# Patient Record
Sex: Female | Born: 1995 | Race: Black or African American | Hispanic: No | Marital: Single | Smoking: Never smoker
Health system: Southern US, Community
[De-identification: ages and names within clinical notes are randomized; demographics above are authoritative.]

## PROBLEM LIST (undated history)

## (undated) ENCOUNTER — Inpatient Hospital Stay (HOSPITAL_COMMUNITY): Payer: Self-pay

## (undated) DIAGNOSIS — Z789 Other specified health status: Secondary | ICD-10-CM

## (undated) HISTORY — PX: WISDOM TOOTH EXTRACTION: SHX21

---
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
## 2016-09-11 NOTE — L&D Delivery Note (Signed)
Delivery Note At 8:45 AM a viable female was delivered via Vaginal, Vacuum Investment banker, operational(Extractor) (Presentation:LOA ).  APGAR: 2, 5, 7; weight pending  .   Placenta status: intact, spontaneous.  Cord:  with the following complications: none .  Cord pH: 6.93.  Anesthesia:  None Episiotomy: Midline, vaginal band cut, then slight perineal episiotomy at midline by Dr Erin FullingHarraway-Smith Lacerations: Second degree Suture Repair: 3.0 vicryl Est. Blood Loss (mL):  150  Mom to postpartum.  Baby to NICU.  Melody Kim is a 21 y.o. female G1P1001 with IUP at 3311w1d pt of [facility] admitted for active labor at term.  She presented with ROM at home with light meconium, thicker meconium noted during delivery. She progressed without augmentation to complete and pushed less than 30 minutes to deliver.  During pushing, FHR to 90s, recovering to 120s and 130s intermittently.  Dr Erin FullingHarraway-Smith called to bedside to assess for vacuum.  NICU team called to be present for delivery.  Dr Erin FullingHarraway-Smith pushed with pt x1, then cut vaginal band of tissue and pushed x 1.  Vacuum applied x 2 pop-offs and additional small episiotomy cut at midline.  Vacuum used to assist during pushing but removed prior to delivery of infant. Cord clamped and cut immediately by Dr Erin FullingHarraway-Smith and infant taken to NICU staff who were present in room at birth time.  Infant stable and maintaining O2 saturation on RA but still transitioning when transported from delivery room to NICU.  FOB went with infant, mother given opportunity to see/hold infant briefly.   Cord gases drawn, limited sample for arterial, arterial and venous samples sent to lab.  Placenta intact and spontaneous, bleeding minimal.  Episiotomy/second degree laceration repaired without difficulty.  Rectal exam wnl.  Mom stable prior to transfer to postpartum. She plans on bottle feeding.   Misty StanleyLisa Leftwich-Kirby 08/16/2017, 9:50 AM

## 2016-12-27 ENCOUNTER — Ambulatory Visit (INDEPENDENT_AMBULATORY_CARE_PROVIDER_SITE_OTHER): Payer: Medicaid Other | Admitting: *Deleted

## 2016-12-27 ENCOUNTER — Encounter: Payer: Self-pay | Admitting: Family Medicine

## 2016-12-27 DIAGNOSIS — Z3201 Encounter for pregnancy test, result positive: Secondary | ICD-10-CM | POA: Diagnosis present

## 2016-12-27 DIAGNOSIS — Z3200 Encounter for pregnancy test, result unknown: Secondary | ICD-10-CM

## 2016-12-27 LAB — POCT PREGNANCY, URINE: PREG TEST UR: POSITIVE — AB

## 2016-12-27 NOTE — Progress Notes (Signed)
Pt informed of +UPT today.  LMP - 11/07/16.  EDD - 08/14/17.  Medication reconciliation completed.

## 2016-12-28 ENCOUNTER — Ambulatory Visit: Payer: Medicaid Other

## 2017-02-14 LAB — OB RESULTS CONSOLE RUBELLA ANTIBODY, IGM: RUBELLA: IMMUNE

## 2017-02-18 LAB — OB RESULTS CONSOLE HIV ANTIBODY (ROUTINE TESTING): HIV: NONREACTIVE

## 2017-02-18 LAB — OB RESULTS CONSOLE RPR: RPR: NONREACTIVE

## 2017-02-18 LAB — OB RESULTS CONSOLE GBS: GBS: NEGATIVE

## 2017-02-18 LAB — OB RESULTS CONSOLE GC/CHLAMYDIA
CHLAMYDIA, DNA PROBE: NEGATIVE
Gonorrhea: NEGATIVE

## 2017-02-18 LAB — OB RESULTS CONSOLE VARICELLA ZOSTER ANTIBODY, IGG: Varicella: IMMUNE

## 2017-02-18 LAB — OB RESULTS CONSOLE HEPATITIS B SURFACE ANTIGEN: Hepatitis B Surface Ag: NEGATIVE

## 2017-02-26 LAB — OB RESULTS CONSOLE HIV ANTIBODY (ROUTINE TESTING)
HIV: NONREACTIVE
HIV: NONREACTIVE
HIV: NONREACTIVE

## 2017-02-26 LAB — OB RESULTS CONSOLE ABO/RH: RH TYPE: POSITIVE

## 2017-02-26 LAB — OB RESULTS CONSOLE ANTIBODY SCREEN: Antibody Screen: NEGATIVE

## 2017-02-26 LAB — OB RESULTS CONSOLE GC/CHLAMYDIA
CHLAMYDIA, DNA PROBE: NEGATIVE
CHLAMYDIA, DNA PROBE: NEGATIVE
CHLAMYDIA, DNA PROBE: NEGATIVE
GC PROBE AMP, GENITAL: NEGATIVE
GC PROBE AMP, GENITAL: NEGATIVE
Gonorrhea: NEGATIVE

## 2017-02-26 LAB — OB RESULTS CONSOLE RUBELLA ANTIBODY, IGM
RUBELLA: IMMUNE
Rubella: IMMUNE
Rubella: IMMUNE

## 2017-02-26 LAB — OB RESULTS CONSOLE VARICELLA ZOSTER ANTIBODY, IGG
VARICELLA IGG: IMMUNE
Varicella: IMMUNE
Varicella: IMMUNE

## 2017-02-26 LAB — OB RESULTS CONSOLE HEPATITIS B SURFACE ANTIGEN
HEP B S AG: NEGATIVE
Hepatitis B Surface Ag: NEGATIVE
Hepatitis B Surface Ag: NEGATIVE

## 2017-02-26 LAB — OB RESULTS CONSOLE RPR
RPR: NONREACTIVE
RPR: NONREACTIVE
RPR: NONREACTIVE

## 2017-04-23 ENCOUNTER — Encounter (HOSPITAL_COMMUNITY): Payer: Self-pay

## 2017-04-23 ENCOUNTER — Inpatient Hospital Stay (HOSPITAL_COMMUNITY)
Admission: AD | Admit: 2017-04-23 | Discharge: 2017-04-23 | Disposition: A | Discharge status: Home / Self Care | Payer: Medicaid Other | Source: Ambulatory Visit | Attending: Obstetrics and Gynecology | Admitting: Obstetrics and Gynecology

## 2017-04-23 DIAGNOSIS — Z91018 Allergy to other foods: Secondary | ICD-10-CM | POA: Insufficient documentation

## 2017-04-23 DIAGNOSIS — R0602 Shortness of breath: Secondary | ICD-10-CM | POA: Diagnosis not present

## 2017-04-23 DIAGNOSIS — Z91012 Allergy to eggs: Secondary | ICD-10-CM | POA: Diagnosis not present

## 2017-04-23 DIAGNOSIS — Z7722 Contact with and (suspected) exposure to environmental tobacco smoke (acute) (chronic): Secondary | ICD-10-CM | POA: Diagnosis not present

## 2017-04-23 DIAGNOSIS — M940 Chondrocostal junction syndrome [Tietze]: Secondary | ICD-10-CM | POA: Diagnosis not present

## 2017-04-23 DIAGNOSIS — R079 Chest pain, unspecified: Secondary | ICD-10-CM | POA: Diagnosis present

## 2017-04-23 DIAGNOSIS — Z91011 Allergy to milk products: Secondary | ICD-10-CM | POA: Insufficient documentation

## 2017-04-23 DIAGNOSIS — R011 Cardiac murmur, unspecified: Secondary | ICD-10-CM | POA: Insufficient documentation

## 2017-04-23 LAB — POCT PREGNANCY, URINE: PREG TEST UR: POSITIVE — AB

## 2017-04-23 LAB — URINALYSIS, ROUTINE W REFLEX MICROSCOPIC
BILIRUBIN URINE: NEGATIVE
Glucose, UA: 50 mg/dL — AB
HGB URINE DIPSTICK: NEGATIVE
KETONES UR: NEGATIVE mg/dL
NITRITE: NEGATIVE
PROTEIN: NEGATIVE mg/dL
SPECIFIC GRAVITY, URINE: 1.024 (ref 1.005–1.030)
pH: 6 (ref 5.0–8.0)
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
## 2017-04-23 NOTE — MAU Provider Note (Signed)
History     CSN: [id number]  Arrival date and time: 04/23/17 1713   None     Chief Complaint  Patient presents with  . Chest Pain   Melody Kim is a 21 y.o. Female coming in for chest pain and SOB. She states she has been having chest pain for the past 4 days, intermittent,sternal, radiates to left and right along rib border, worse with sitting, not aggravated with activity, better with stretching and reproducible on palpation. SOB started today described as a feeling to need to take a deeper breath while sitting, no tachypnea, syncope, pleurisy with feeling. Otherwise healthy female.   No FHX of DVT, blood clots, heart disease. Non smoker.     Chest Pain   Associated symptoms include back pain, palpitations and shortness of breath. Pertinent negatives include no abdominal pain, cough, dizziness, fever, headaches, nausea, numbness or vomiting.      No past medical history on file.  No past surgical history on file.  No family history on file.  Social History  Substance Use Topics  . Smoking status: Passive Smoke Exposure - Never Smoker  . Smokeless tobacco: Not on file  . Alcohol use Not on file    Allergies:  Allergies  Allergen Reactions  . Chocolate   . Eggs Or Egg-Derived Products   . Milk-Related Compounds     No prescriptions prior to admission.    Review of Systems  Constitutional: Negative for fever.  Respiratory: Positive for shortness of breath. Negative for apnea, cough, chest tightness and wheezing.   Cardiovascular: Positive for chest pain and palpitations.  Gastrointestinal: Negative for abdominal pain, nausea and vomiting.  Musculoskeletal: Positive for back pain.  Neurological: Negative for dizziness, syncope, numbness and headaches.   Physical Exam   Blood pressure (!) 110/57, pulse (!) 109, temperature 98.4 F (36.9 C), temperature source Oral, resp. rate 16.  Physical Exam  Vitals reviewed. Constitutional: She appears  well-developed.  HENT:  Head: Normocephalic.  Eyes: Pupils are equal, round, and reactive to light.  Neck: No JVD present.  Cardiovascular: Normal rate and regular rhythm.   S1,S2 normal, 2/6 murmur loudest at along left sternal border on expiration.   Respiratory: Effort normal. No respiratory distress. She has no wheezes. She has no rales. She exhibits no tenderness.  Musculoskeletal: She exhibits no edema or tenderness.  Skin: No rash noted. No erythema.    MAU Course  ED EKG  Date/Time: 04/23/2017 6:16 PM Performed by: Joen Laura C Authorized by: Willodean Rosenthal  Interpreted by ED physician Previous ECG: no previous ECG available Rhythm: sinus tachycardia ST Segments: ST segments normal Clinical impression: normal ECG    MDM. Chest pain reproducible on exam.  EKG sinus tachycardia with no ST or T segment changes.    Assessment and Plan  Melody Kim is a 21 y.o. female coming in for chest pain and SOB.   Chest pain most likely costochondritis; hx reassuring against cardiac cause, EKG showed no ST elevation or depressions.   SOB: likely mild positional atelectasis; O2Sats reassuring in ED, no signs/symptoms of pneumonia.    New Problem: Murmur found on exam; new to patient. Should f/u with PCP.   Velta Addison White 04/23/2017, 6:03 PM   OB FELLOW MAU DISCHARGE ATTESTATION  I have seen and examined this patient; I agree with above documentation in the resident's note.  Highlights: Pain reproducible on exam. Some correlation with food as well. Patient in NAD, normal cardiothoracic exam other than  systolic flow murmur, likely related to pregnancy.  --D/c home in stable condition --Discussed return precautions in length, including worsening chest pain, SOB, pre-syncope/syncope., or any other new concerning symptoms.    Frederik PearJulie P Belinda Bringhurst, MD OB Fellow 6:20 PM

## 2017-07-20 LAB — OB RESULTS CONSOLE GBS: STREP GROUP B AG: NEGATIVE

## 2017-08-14 ENCOUNTER — Other Ambulatory Visit: Payer: Self-pay

## 2017-08-14 ENCOUNTER — Encounter (HOSPITAL_COMMUNITY): Payer: Self-pay

## 2017-08-14 ENCOUNTER — Inpatient Hospital Stay (HOSPITAL_COMMUNITY)
Admission: AD | Admit: 2017-08-14 | Discharge: 2017-08-14 | Disposition: A | Discharge status: Home / Self Care | Payer: Medicaid Other | Source: Ambulatory Visit | Attending: Family Medicine | Admitting: Family Medicine

## 2017-08-14 DIAGNOSIS — O471 False labor at or after 37 completed weeks of gestation: Secondary | ICD-10-CM | POA: Diagnosis not present

## 2017-08-14 DIAGNOSIS — Z3A40 40 weeks gestation of pregnancy: Secondary | ICD-10-CM | POA: Diagnosis not present

## 2017-08-14 NOTE — Discharge Instructions (Signed)

## 2017-08-14 NOTE — MAU Note (Signed)
Pt presents to MAU with c/o of contractions that started at 0400 today. Contractions have gotten stronger since then and are 6-8 min apart. Pt denies LOF and vaginal bleeding. +FM

## 2017-08-14 NOTE — MAU Note (Signed)
I have communicated with Steward DroneVeronica Rogers, CNM and reviewed vital signs:  Vitals:   08/14/17 1117  BP: 114/74  Pulse: 95  Resp: 18  Temp: 98.2 F (36.8 C)  SpO2: 100%    Vaginal exam:  Dilation: Fingertip Effacement (%): Thick Cervical Position: Posterior Station: -3 Presentation: Vertex Exam by:: [place]any Bently Morath, RN ,   Also reviewed contraction pattern and that non-stress test is reactive.  It has been documented that patient is contracting every 2-6 minutes since 0400; 0.5 cm not indicating active labor.  Patient denies any other complaints.  Based on this report provider has given order for discharge.  A discharge order and diagnosis entered by a provider.   Labor discharge instructions reviewed with patient.

## 2017-08-15 ENCOUNTER — Inpatient Hospital Stay (HOSPITAL_COMMUNITY)
Admission: AD | Admit: 2017-08-15 | Discharge: 2017-08-15 | Disposition: A | Discharge status: Home / Self Care | Payer: Medicaid Other | Source: Ambulatory Visit | Attending: Family Medicine | Admitting: Family Medicine

## 2017-08-15 ENCOUNTER — Encounter (HOSPITAL_COMMUNITY): Payer: Self-pay | Admitting: *Deleted

## 2017-08-15 DIAGNOSIS — O479 False labor, unspecified: Secondary | ICD-10-CM

## 2017-08-15 DIAGNOSIS — O471 False labor at or after 37 completed weeks of gestation: Secondary | ICD-10-CM

## 2017-08-15 HISTORY — DX: Other specified health status: Z78.9

## 2017-08-15 NOTE — Discharge Instructions (Signed)
Return to MAU if contractions get stronger and closer together (every 2-3 min consistently for 1-2 hours), or if your water breaks (large gush of fluid).     Braxton Hicks Contractions Contractions of the uterus can occur throughout pregnancy, but they are not always a sign that you are in labor. You may have practice contractions called Braxton Hicks contractions. These false labor contractions are sometimes confused with true labor. What are Melody PeltonBraxton Hicks contractions? Braxton Hicks contractions are tightening movements that occur in the muscles of the uterus before labor. Unlike true labor contractions, these contractions do not result in opening (dilation) and thinning of the cervix. Toward the end of pregnancy (32-34 weeks), Braxton Hicks contractions can happen more often and may become stronger. These contractions are sometimes difficult to tell apart from true labor because they can be very uncomfortable. You should not feel embarrassed if you go to the hospital with false labor. Sometimes, the only way to tell if you are in true labor is for your health care provider to look for changes in the cervix. The health care provider will do a physical exam and may monitor your contractions. If you are not in true labor, the exam should show that your cervix is not dilating and your water has not broken. If there are no prenatal problems or other health problems associated with your pregnancy, it is completely safe for you to be sent home with false labor. You may continue to have Braxton Hicks contractions until you go into true labor. How can I tell the difference between true labor and false labor?  Differences ? False labor ? Contractions last 30-70 seconds.: Contractions are usually shorter and not as strong as true labor contractions. ? Contractions become very regular.: Contractions are usually irregular. ? Discomfort is usually felt in the top of the uterus, and it spreads to the lower  abdomen and low back.: Contractions are often felt in the front of the lower abdomen and in the groin. ? Contractions do not go away with walking.: Contractions may go away when you walk around or change positions while lying down. ? Contractions usually become more intense and increase in frequency.: Contractions get weaker and are shorter-lasting as time goes on. ? The cervix dilates and gets thinner.: The cervix usually does not dilate or become thin. Follow these instructions at home:  Take over-the-counter and prescription medicines only as told by your health care provider.  Keep up with your usual exercises and follow other instructions from your health care provider.  Eat and drink lightly if you think you are going into labor.  If Braxton Hicks contractions are making you uncomfortable: ? Change your position from lying down or resting to walking, or change from walking to resting. ? Sit and rest in a tub of warm water. ? Drink enough fluid to keep your urine clear or pale yellow. Dehydration may cause these contractions. ? Do slow and deep breathing several times an hour.  Keep all follow-up prenatal visits as told by your health care provider. This is important. Contact a health care provider if:  You have a fever.  You have continuous pain in your abdomen. Get help right away if:  Your contractions become stronger, more regular, and closer together.  You have fluid leaking or gushing from your vagina.  You pass blood-tinged mucus (bloody show).  You have bleeding from your vagina.  You have low back pain that you never had before.  You feel your  babys head pushing down and causing pelvic pressure.  Your baby is not moving inside you as much as it used to. Summary  Contractions that occur before labor are called Braxton Hicks contractions, false labor, or practice contractions.  Braxton Hicks contractions are usually shorter, weaker, farther apart, and less  regular than true labor contractions. True labor contractions usually become progressively stronger and regular and they become more frequent.  Manage discomfort from [facility] contractions by changing position, resting in a warm bath, drinking plenty of water, or practicing deep breathing. This information is not intended to replace advice given to you by your health care provider. Make sure you discuss any questions you have with your health care provider. Document Released: 08/28/2005 Document Revised: 07/17/2016 Document Reviewed: 07/17/2016 Elsevier Interactive Patient Education  2017 [employer]

## 2017-08-15 NOTE — Discharge Instructions (Signed)

## 2017-08-15 NOTE — MAU Note (Signed)
Pt presents with c/o onset of regular ctxs since 0400 this morning.  Reports ctxs are every 2 minutes.  Denies VB, states leaking white fluid since 2200 last night.  Reports +FM.   Pt states seen in MAU yesterday for labor eva, cervix 1cm.l

## 2017-08-15 NOTE — Progress Notes (Signed)
Pt instructed she may ambulate.  Instructed to return to unit @ 1900 or sooner if ROM, VB, or any other concerns.  Pt verbalized understanding & agreeable.

## 2017-08-15 NOTE — MAU Note (Signed)
Pt reports worsening contractions, back pain and gushes of fluid since 2200.

## 2017-08-16 ENCOUNTER — Encounter (HOSPITAL_COMMUNITY): Payer: Self-pay | Admitting: *Deleted

## 2017-08-16 ENCOUNTER — Inpatient Hospital Stay (HOSPITAL_COMMUNITY)
Admission: AD | Admit: 2017-08-16 | Discharge: 2017-08-18 | DRG: 806 | Disposition: A | Discharge status: Home / Self Care | Payer: Medicaid Other | Source: Ambulatory Visit | Attending: Obstetrics & Gynecology | Admitting: Obstetrics & Gynecology

## 2017-08-16 ENCOUNTER — Other Ambulatory Visit: Payer: Self-pay

## 2017-08-16 DIAGNOSIS — N390 Urinary tract infection, site not specified: Secondary | ICD-10-CM | POA: Diagnosis not present

## 2017-08-16 DIAGNOSIS — Z3483 Encounter for supervision of other normal pregnancy, third trimester: Secondary | ICD-10-CM | POA: Diagnosis present

## 2017-08-16 DIAGNOSIS — O8620 Urinary tract infection following delivery, unspecified: Secondary | ICD-10-CM | POA: Diagnosis present

## 2017-08-16 DIAGNOSIS — Z3A40 40 weeks gestation of pregnancy: Secondary | ICD-10-CM | POA: Diagnosis not present

## 2017-08-16 DIAGNOSIS — O4290 Premature rupture of membranes, unspecified as to length of time between rupture and onset of labor, unspecified weeks of gestation: Secondary | ICD-10-CM | POA: Diagnosis present

## 2017-08-16 LAB — TYPE AND SCREEN
ABO/RH(D): O POS
Antibody Screen: NEGATIVE

## 2017-08-16 LAB — ABO/RH: ABO/RH(D): O POS

## 2017-08-16 LAB — CBC
HEMATOCRIT: 36.6 % (ref 36.0–46.0)
HEMOGLOBIN: 11.2 g/dL — AB (ref 12.0–15.0)
MCH: 20.9 pg — ABNORMAL LOW (ref 26.0–34.0)
MCHC: 30.6 g/dL (ref 30.0–36.0)
MCV: 68.2 fL — AB (ref 78.0–100.0)
PLATELETS: 323 10*3/uL (ref 150–400)
RBC: 5.37 MIL/uL — AB (ref 3.87–5.11)
RDW: 15.6 % — ABNORMAL HIGH (ref 11.5–15.5)
WBC: 15.4 10*3/uL — AB (ref 4.0–10.5)

## 2017-08-16 LAB — RPR: RPR: NONREACTIVE

## 2017-08-16 MED ORDER — COCONUT OIL OIL: 1.0000 "application " | TOPICAL_OIL | Status: DC | PRN

## 2017-08-16 MED ORDER — LIDOCAINE HCL (PF) 1 % IJ SOLN
30.0000 mL | INTRAMUSCULAR | Status: DC | PRN
  Administered 2017-08-16: 30 mL via SUBCUTANEOUS
  Filled 2017-08-16: qty 30

## 2017-08-16 MED ORDER — OXYTOCIN 40 UNITS IN LACTATED RINGERS INFUSION - SIMPLE MED
2.5000 [IU]/h | INTRAVENOUS | Status: DC
  Administered 2017-08-16: 2.5 [IU]/h via INTRAVENOUS
  Filled 2017-08-16: qty 1000

## 2017-08-16 MED ORDER — ONDANSETRON HCL 4 MG/2ML IJ SOLN: 4.0000 mg | INTRAMUSCULAR | Status: DC | PRN

## 2017-08-16 MED ORDER — SOD CITRATE-CITRIC ACID 500-334 MG/5ML PO SOLN: 30.0000 mL | ORAL | Status: DC | PRN

## 2017-08-16 MED ORDER — WITCH HAZEL-GLYCERIN EX PADS: 1.0000 "application " | MEDICATED_PAD | CUTANEOUS | Status: DC | PRN

## 2017-08-16 MED ORDER — FENTANYL 2.5 MCG/ML BUPIVACAINE 1/10 % EPIDURAL INFUSION (WH - ANES): 14.0000 mL/h | INTRAMUSCULAR | Status: DC | PRN

## 2017-08-16 MED ORDER — PRENATAL MULTIVITAMIN CH: 1.0000 | ORAL_TABLET | Freq: Every day | ORAL | Status: DC

## 2017-08-16 MED ORDER — ONDANSETRON HCL 4 MG/2ML IJ SOLN: 4.0000 mg | Freq: Four times a day (QID) | INTRAMUSCULAR | Status: DC | PRN

## 2017-08-16 MED ORDER — ACETAMINOPHEN 160 MG/5ML PO SOLN: 650.0000 mg | ORAL | Status: DC | PRN

## 2017-08-16 MED ORDER — DIPHENHYDRAMINE HCL 50 MG/ML IJ SOLN: 12.5000 mg | INTRAMUSCULAR | Status: DC | PRN

## 2017-08-16 MED ORDER — COMPLETENATE 29-1 MG PO CHEW
1.0000 | CHEWABLE_TABLET | Freq: Every day | ORAL | Status: DC
  Administered 2017-08-16 – 2017-08-18 (×3): 1 via ORAL
  Filled 2017-08-16 (×4): qty 1

## 2017-08-16 MED ORDER — ACETAMINOPHEN 325 MG PO TABS: 650.0000 mg | ORAL_TABLET | ORAL | Status: DC | PRN

## 2017-08-16 MED ORDER — BENZOCAINE-MENTHOL 20-0.5 % EX AERO
1.0000 "application " | INHALATION_SPRAY | CUTANEOUS | Status: DC | PRN
  Administered 2017-08-16: 1 via TOPICAL
  Filled 2017-08-16: qty 56

## 2017-08-16 MED ORDER — PHENYLEPHRINE 40 MCG/ML (10ML) SYRINGE FOR IV PUSH (FOR BLOOD PRESSURE SUPPORT): 80.0000 ug | PREFILLED_SYRINGE | INTRAVENOUS | Status: DC | PRN

## 2017-08-16 MED ORDER — EPHEDRINE 5 MG/ML INJ: 10.0000 mg | INTRAVENOUS | Status: DC | PRN

## 2017-08-16 MED ORDER — TETANUS-DIPHTH-ACELL PERTUSSIS 5-2.5-18.5 LF-MCG/0.5 IM SUSP: 0.5000 mL | Freq: Once | INTRAMUSCULAR | Status: DC

## 2017-08-16 MED ORDER — EPHEDRINE 5 MG/ML INJ
10.0000 mg | INTRAVENOUS | Status: DC | PRN
Start: 2017-08-16 — End: 2017-08-16

## 2017-08-16 MED ORDER — DIPHENHYDRAMINE HCL 25 MG PO CAPS: 25.0000 mg | ORAL_CAPSULE | Freq: Four times a day (QID) | ORAL | Status: DC | PRN

## 2017-08-16 MED ORDER — SENNOSIDES-DOCUSATE SODIUM 8.6-50 MG PO TABS
2.0000 | ORAL_TABLET | ORAL | Status: DC
  Filled 2017-08-16: qty 2

## 2017-08-16 MED ORDER — SIMETHICONE 80 MG PO CHEW: 80.0000 mg | CHEWABLE_TABLET | ORAL | Status: DC | PRN

## 2017-08-16 MED ORDER — OXYTOCIN BOLUS FROM INFUSION
500.0000 mL | Freq: Once | INTRAVENOUS | Status: AC
  Administered 2017-08-16: 500 mL via INTRAVENOUS

## 2017-08-16 MED ORDER — LACTATED RINGERS IV SOLN
500.0000 mL | INTRAVENOUS | Status: DC | PRN
  Administered 2017-08-16: 1000 mL via INTRAVENOUS

## 2017-08-16 MED ORDER — OXYCODONE-ACETAMINOPHEN 5-325 MG PO TABS: 2.0000 | ORAL_TABLET | ORAL | Status: DC | PRN

## 2017-08-16 MED ORDER — LACTATED RINGERS IV SOLN
INTRAVENOUS | Status: DC
  Administered 2017-08-16: 08:00:00 via INTRAVENOUS

## 2017-08-16 MED ORDER — DIBUCAINE 1 % RE OINT: 1.0000 "application " | TOPICAL_OINTMENT | RECTAL | Status: DC | PRN

## 2017-08-16 MED ORDER — FENTANYL CITRATE (PF) 100 MCG/2ML IJ SOLN: 100.0000 ug | INTRAMUSCULAR | Status: DC | PRN

## 2017-08-16 MED ORDER — OXYCODONE-ACETAMINOPHEN 5-325 MG PO TABS: 1.0000 | ORAL_TABLET | ORAL | Status: DC | PRN

## 2017-08-16 MED ORDER — IBUPROFEN 100 MG/5ML PO SUSP
600.0000 mg | Freq: Four times a day (QID) | ORAL | Status: DC | PRN
  Filled 2017-08-16: qty 30

## 2017-08-16 MED ORDER — LACTATED RINGERS IV SOLN: 500.0000 mL | Freq: Once | INTRAVENOUS | Status: DC

## 2017-08-16 MED ORDER — IBUPROFEN 100 MG/5ML PO SUSP
600.0000 mg | Freq: Four times a day (QID) | ORAL | Status: DC
  Administered 2017-08-16 – 2017-08-18 (×9): 600 mg via ORAL
  Filled 2017-08-16 (×13): qty 30

## 2017-08-16 MED ORDER — ZOLPIDEM TARTRATE 5 MG PO TABS
5.0000 mg | ORAL_TABLET | Freq: Every evening | ORAL | Status: DC | PRN
Start: 2017-08-16 — End: 2017-08-18

## 2017-08-16 MED ORDER — ONDANSETRON HCL 4 MG PO TABS: 4.0000 mg | ORAL_TABLET | ORAL | Status: DC | PRN

## 2017-08-16 NOTE — Progress Notes (Signed)
This note also relates to the following rows which could not be included: BP - Cannot attach notes to unvalidated device data Pulse Rate - Cannot attach notes to unvalidated device data  NICU in room for del

## 2017-08-16 NOTE — H&P (Signed)
Melody Kim is a 21 y.o. female G1P1001 @[redacted]w[redacted]d  pt of [facility] presenting for rupture of membranes and active labor. She presented to MAU in active labor at 9.5 cm and [facility] She reports good fetal movement, and reports leakage of fluid is clear.  She denies vaginal bleeding, vaginal itching/burning, urinary symptoms, h/a, dizziness, n/v, or fever/chills.      OB History    Gravida Para Term Preterm AB Living   1 1 1     1    SAB TAB Ectopic Multiple Live Births         0 1     Past Medical History:  Diagnosis Date  . Medical history non-contributory    Past Surgical History:  Procedure Laterality Date  . WISDOM TOOTH EXTRACTION     Family History: family history includes Diabetes in her father and mother. Social History:  reports that  has never smoked. she has never used smokeless tobacco. She reports that she does not drink alcohol or use drugs.     Maternal Diabetes: No Genetic Screening: Normal Maternal Ultrasounds/Referrals: Normal Fetal Ultrasounds or other Referrals:  None Maternal Substance Abuse:  No Significant Maternal Medications:  None Significant Maternal Lab Results:  Lab values include: Group B Strep negative Other Comments:  None  Review of Systems  Constitutional: Negative for chills, fever and malaise/fatigue.  Eyes: Negative for blurred vision.  Respiratory: Negative for cough and shortness of breath.   Cardiovascular: Negative for chest pain.  Gastrointestinal: Positive for abdominal pain. Negative for heartburn and vomiting.  Genitourinary: Negative for dysuria, frequency and urgency.  Musculoskeletal: Negative.   Neurological: Negative for dizziness and headaches.  Psychiatric/Behavioral: Negative for depression.   Maternal Medical History:  Reason for admission: Rupture of membranes and contractions.   Contractions: Onset was 3-5 hours ago.   Frequency: regular.   Perceived severity is strong.    Fetal  activity: Perceived fetal activity is normal.   Last perceived fetal movement was within the past hour.    Prenatal complications: no prenatal complications Prenatal Complications - Diabetes: none.    Dilation: 10 Station: +3 Exam by:: Leftwich-Kirby Blood pressure (!) 103/49, pulse (!) 103, temperature 98.2 F (36.8 C), temperature source Oral, resp. rate 18, height 4\' 11"  (1.499 m), weight 131 lb (59.4 kg), SpO2 98 %, unknown if currently breastfeeding. Maternal Exam:  Uterine Assessment: Contraction strength is moderate.    Fetal Exam Fetal Monitor Review: Mode: ultrasound.   Baseline rate: 130.  Variability: moderate (6-25 bpm).   FHR baseline 130 with moderate variability. FHR 130 when EFM placed, and rose to 150 x 2 minutes, then returned to baseline of 130 x several minutes.  FHR down to 100 x 2-3 minutes and returned to 120 prior to [facility].      Physical Exam  Nursing note and vitals reviewed. Constitutional: She is oriented to person, place, and time. She appears well-developed and well-nourished.  Neck: Normal range of motion.  Cardiovascular: Normal rate.  Respiratory: Effort normal.  GI: Soft.  Musculoskeletal: Normal range of motion.  Neurological: She is alert and oriented to person, place, and time.  Skin: Skin is warm and dry.  Psychiatric: She has a normal mood and affect. Her behavior is normal. Judgment and thought content normal.    Prenatal labs: ABO, Rh: --/--/O POS (12/06 0802) Antibody: NEG (12/06 0802) Rubella: Immune, Immune, Immune (06/18 0000) RPR: Non Reactive (12/06 0802)  HBsAg: Negative, Negative, Negative (  06/18 0000)  HIV: Non-reactive, Non-reactive, Non-reactive (06/18 0000)  GBS: Negative (11/09 0000)   Assessment/Plan: G1P1001 @[redacted]w[redacted]d   Active labor at term SROM GBS negative  [facility] Anticipate NSVD  Sharen CounterLisa Leftwich-Kirby 08/16/2017, 5:39 PM

## 2017-08-16 NOTE — MAU Note (Signed)
Pt reports water broke about 7am clear fluid. C/o contractions. 4cm  Yesterday in office.

## 2017-08-17 NOTE — Progress Notes (Signed)
Post Partum Day 1 Subjective: no complaints, up ad lib, voiding and tolerating PO  Objective: Blood pressure 107/64, pulse (!) 125, temperature 98.3 F (36.8 C), temperature source Oral, resp. rate 18, height 4\' 11"  (1.499 m), weight 131 lb (59.4 kg), SpO2 100 %, unknown if currently breastfeeding.  Physical Exam:  General: alert, cooperative and appears stated age Lochia: appropriate Uterine Fundus: firm DVT Evaluation: Negative Homan's sign.  Recent Labs    08/16/17 0800  HGB 11.2*  HCT 36.6    Assessment/Plan: Plan for discharge tomorrow, Breastfeeding and Contraception condoms   LOS: 1 day   Melody Kim 08/17/2017, 12:36 PM

## 2017-08-17 NOTE — Lactation Note (Signed)
This note was copied from a baby's chart. Lactation Consultation Note  Patient Name: Melody Kim ZOXWR'UToday's Date: 08/17/2017 Reason for consult: Initial assessment;Term;NICU baby;Primapara;1st time breastfeeding   Initial consult at 9336 hrs old with NICU mom; Mom is P1.  GA 40.1; BW 6 lbs, 10 oz.  Infant had respiratory depression at birth with apgars 2/5/7; large umbilical hernia, poor tone; r/o sepsis.   RN started mom pumping last night with DEBP.   NICU booklet given to mom and reviewed info in booklet.  Encouraged mom to start pumping log in am; instructed to pump every 2 hrs during the day and at least once during the night for a total of 8+ pumpings per day. Gave curved-tip syring and colostrum collection containers. Taught hand expression with return demonstration and observation of colostrum barely glistening tip of nipple. Mom had pumped 2-3 ml clear thin colostrum ~1400 earlier today that was still in container.   Reviewed proper milk storage; Labels ordered and RN took in to mom's room.  Instructed to label with date and time of pumping.   Taught mom how to pump on "initiate" setting turning dial up to 3-4 drops as the setting using hands-on pumping with hand expression at end of pumping session for an additional 10 minutes.   [place]  WIC has already visited mom and given her a pump.  WIC pump in room with mom.     Maternal Data Has patient been taught Hand Expression?: Yes(with return demonstration; colostrum barely glistening tip after several minutes of HE) Does the patient have breastfeeding experience prior to this delivery?: No  Feeding    LATCH Score       Type of Nipple: Everted at rest and after stimulation  Comfort (Breast/Nipple): Soft / non-tender        Interventions Interventions: Expressed milk;Hand express  Lactation Tools Discussed/Used WIC Program: Yes Pump Review: Setup, frequency, and cleaning;Milk  Storage Initiated by:: RN Date initiated:: 08/16/17   Consult Status Consult Status: Follow-up Follow-up type: In-patient    Lendon KaVann, Ellenore Roscoe Walker 08/17/2017, 9:17 PM

## 2017-08-18 DIAGNOSIS — N390 Urinary tract infection, site not specified: Secondary | ICD-10-CM | POA: Diagnosis not present

## 2017-08-18 LAB — URINALYSIS, ROUTINE W REFLEX MICROSCOPIC
Bilirubin Urine: NEGATIVE
GLUCOSE, UA: NEGATIVE mg/dL
Hgb urine dipstick: NEGATIVE
KETONES UR: NEGATIVE mg/dL
Nitrite: POSITIVE — AB
PH: 6 (ref 5.0–8.0)
PROTEIN: 100 mg/dL — AB
Specific Gravity, Urine: 1.021 (ref 1.005–1.030)

## 2017-08-18 LAB — CBC
HCT: 30 % — ABNORMAL LOW (ref 36.0–46.0)
Hemoglobin: 9.4 g/dL — ABNORMAL LOW (ref 12.0–15.0)
MCH: 21.3 pg — AB (ref 26.0–34.0)
MCHC: 31.3 g/dL (ref 30.0–36.0)
MCV: 67.9 fL — AB (ref 78.0–100.0)
PLATELETS: 158 10*3/uL (ref 150–400)
RBC: 4.42 MIL/uL (ref 3.87–5.11)
RDW: 15.8 % — AB (ref 11.5–15.5)
WBC: 7.8 10*3/uL (ref 4.0–10.5)

## 2017-08-18 MED ORDER — CEPHALEXIN 500 MG PO CAPS: 500.0000 mg | ORAL_CAPSULE | Freq: Four times a day (QID) | ORAL | 0 refills | Status: DC

## 2017-08-18 MED ORDER — CEPHALEXIN 500 MG PO CAPS
500.0000 mg | ORAL_CAPSULE | Freq: Four times a day (QID) | ORAL | Status: DC
  Administered 2017-08-18 (×2): 500 mg via ORAL
  Filled 2017-08-18 (×2): qty 1

## 2017-08-18 MED ORDER — IBUPROFEN 100 MG/5ML PO SUSP: 600.0000 mg | Freq: Four times a day (QID) | ORAL | 0 refills | Status: DC

## 2017-08-18 NOTE — Lactation Note (Signed)
This note was copied from a baby's chart. Lactation Consultation Note: Follow up visit with this mom of NICU baby. Reports she pumped 6 times yesterday but did not obtain any Colostrum. Encouraged hand expression after pumping. [facility]. States she does not plan to put baby to the breast- will pump and bottle feed EBM and formula. No questions at present. To call prn  Patient Name: Melody Kim WUJWJ'XToday's Date: 08/18/2017 Reason for consult: Follow-up assessment;NICU baby   Maternal Data Formula Feeding for Exclusion: Yes Reason for exclusion: Mother's choice to formula feed on admision Has patient been taught Hand Expression?: Yes Does the patient have breastfeeding experience prior to this delivery?: No  Feeding    LATCH Score                   Interventions    Lactation Tools Discussed/Used WIC Program: Yes   Consult Status Consult Status: Complete    Pamelia HoitWeeks, Madeeha Costantino D 08/18/2017, 7:49 AM

## 2017-08-18 NOTE — Progress Notes (Signed)
Post Partum Day 2 Subjective: no complaints and up ad lib has been afebrile without discomfort since this a.m. Will treat for presumed uti. Pt reports that her baby is stable on cooling blanket downstairs, to come off tomorrow. Objective: Blood pressure (!) 95/56, pulse (!) 121, temperature 98.6 F (37 C), resp. rate 20, height 4\' 11"  (1.499 m), weight 131 lb (59.4 kg), SpO2 100 %, unknown if currently breastfeeding.  Physical Exam:  General: alert and cooperative Lochia: appropriate Uterine Fundus: firm Incision:  DVT Evaluation: No evidence of DVT seen on physical exam.  Recent Labs    08/16/17 0800 08/18/17 0651  HGB 11.2* 9.4*  HCT 36.6 30.0*    Assessment/Plan: PP uti  normal pp care Discharge home   LOS: 2 days   Tilda BurrowJohn V Makaylyn Sinyard 08/18/2017, 3:20 PM

## 2017-08-18 NOTE — Discharge Summary (Signed)
OB Discharge Summary     Patient Name: Melody Kim DOB: Mar 30, 1996 MRN: [id number]  Date of admission: 08/16/2017 Delivering MD: Sharen CounterLEFTWICH-KIRBY, Melody A   Date of discharge: 08/18/2017  Admitting diagnosis: WATER BROKE, meconium stained fluid. Intrauterine pregnancy: 4037w1d     Secondary diagnosis:  Active Problems:   Amniotic fluid leaking   UTI (urinary tract infection) Meconium stained fluid  Additional problems:      Discharge diagnosis: Term Pregnancy Delivered                                                                                               Meconium staining                                              Fetal condition requiring NICU admit                                                 Post partum procedures:urine culture  Augmentation: Pitocin     Hospital course:  Onset of Labor With Vaginal Delivery     21 y.o. yo G1P1001 at 5237w1d was admitted in Active Labor on 08/16/2017. Patient had acomplicated labor course as follows: Complications:   Thick meconiumAlexzandria S Leary Kim is a 21 y.o. female G1P1001 with IUP at 937w1d pt of [facility] admitted for active labor at term.  She presented with ROM at home with light meconium, thicker meconium noted during delivery. She progressed without augmentation to complete and pushed less than 30 minutes to deliver.  During pushing, FHR to 90s, recovering to 120s and 130s intermittently.  Dr Melody Kim called to bedside to assess for vacuum.  NICU team called to be present for delivery.  Dr Melody Kim pushed with pt x1, then cut vaginal band of tissue and pushed x 1.  Vacuum applied x 2 pop-offs and additional small episiotomy cut at midline.  Vacuum used to assist during pushing but removed prior to delivery of infant. Cord clamped and cut immediately by Dr Melody Kim and infant taken to NICU staff who were present in room at birth time.  Infant stable and maintaining O2 saturation on RA but still transitioning  when transported from delivery room to NICU.  FOB went with infant, mother given opportunity to see/hold infant briefly.   Cord gases drawn, limited sample for arterial, arterial and venous samples sent to lab.  Placenta intact and spontaneous, bleeding minimal.  Episiotomy/second degree laceration repaired without difficulty.  Rectal exam wnl.  Mom stable prior to transfer to postpartum. She plans on bottle feeding.   Membrane Rupture Time/Date: 7:00 AM ,08/16/2017   Intrapartum Procedures: Episiotomy: Median [2]  Lacerations:  2nd degree [3]  Patient had a delivery of a Viable infant. 08/16/2017  Information for the patient's newborn:  Melody SeverinMahoney, Girl Melody [[id number]][[id number]]  Delivery Method: Vaginal, Vacuum (Extractor)(Filed from Delivery Summary)    Pateint had an uncomplicated postpartum course.  She is ambulating, tolerating a regular diet, passing flatus, and urinating well. Patient is discharged home in stable condition on 08/18/17.   Physical exam  Vitals:   08/17/17 1901 08/17/17 2146 08/18/17 0636 08/18/17 0812  BP:  104/62 113/70 (!) 95/56  Pulse:  (!) 122 (!) 145 (!) 121  Resp:  18 18 20   Temp: 99.5 F (37.5 C) 98.6 F (37 C) (!) 100.7 F (38.2 C) 98.6 F (37 C)  TempSrc: Oral Oral Oral   SpO2:  100% 100%   Weight:      Height:       General: alert and cooperative Lochia: appropriate Uterine Fundus: firm Incision:  DVT Evaluation: No evidence of DVT seen on physical exam. Labs: Lab Results  Component Value Date   WBC 7.8 08/18/2017   HGB 9.4 (L) 08/18/2017   HCT 30.0 (L) 08/18/2017   MCV 67.9 (L) 08/18/2017   PLT 158 08/18/2017   CMP Latest Ref Rng & Units 08/08/2014  Glucose 70 - 99 mg/dL 782(N101(H)  BUN 6 - 23 mg/dL 18  Creatinine 5.620.50 - 1.301.00 mg/dL 8.650.67  Sodium 784137 - 696147 mEq/L 138  Potassium 3.7 - 5.3 mEq/L 4.0  Chloride 96 - 112 mEq/L 103  CO2 19 - 32 mEq/L 20  Calcium 8.4 - 10.5 mg/dL 9.7  Total Protein 6.0 - 8.3  g/dL 8.3  Total Bilirubin 0.3 - 1.2 mg/dL <2.9(B<0.2(L)  Alkaline Phos 47 - 119 U/L 108  AST 0 - 37 U/L 21  ALT 0 - 35 U/L 23    Discharge instruction: per After Visit Summary and "Baby and Me Booklet".  After visit meds:  Allergies as of 08/18/2017   No Known Allergies     Medication List    TAKE these medications   cephALEXin 500 MG capsule Commonly known as:  KEFLEX Take 1 capsule (500 mg total) by mouth 4 (four) times daily.   hydrocortisone cream 1 % Apply 1 application topically 2 (two) times daily.   ibuprofen 100 MG/5ML suspension Commonly known as:  ADVIL,MOTRIN Take 30 mLs (600 mg total) by mouth every 6 (six) hours.   prenatal multivitamin Tabs tablet Take 1 tablet by mouth daily at 12 noon.       Diet: routine diet  Activity: Advance as tolerated. Pelvic rest for 6 weeks.   Outpatient follow up:6 weeks Follow up Appt:No future appointments. Follow up Visit:No Follow-up on file.  [facility]  Newborn Data: Live born female  Birth Weight: 6 lb 10.2 oz (3010 g) APGAR: 2, 5  Newborn Delivery   Birth date/time:  08/16/2017 08:45:00 Delivery type:  Vaginal, Vacuum (Extractor)     Baby Feeding: Bottle Disposition:NICU   08/18/2017 Melody BurrowJohn V Sharma Lawrance, MD

## 2017-08-18 NOTE — Progress Notes (Signed)
Post Partum Day #2 Subjective: up ad lib, voiding and tolerating PO; baby in NICU- pumping breasts; desires condoms or Depo for contraception; reports slight intermittent H/A and +chills, but no other report of pain/dysuria/cough  Objective: Blood pressure 113/70, pulse (!) 145, temperature (!) 100.7 F (38.2 C), temperature source Oral, resp. rate 18, height 4\' 11"  (1.499 m), weight 59.4 kg (131 lb), SpO2 100 %, unknown if currently breastfeeding.  Physical Exam:  General: alert, cooperative and no distress Lochia: appropriate Uterine Fundus: firm, nontender DVT Evaluation: No evidence of DVT seen on physical exam.  Recent Labs    08/16/17 0800 08/18/17 0651  HGB 11.2* 9.4*  HCT 36.6 30.0*   CBC    Component Value Date/Time   WBC 7.8 08/18/2017 0651   RBC 4.42 08/18/2017 0651   HGB 9.4 (L) 08/18/2017 0651   HCT 30.0 (L) 08/18/2017 0651   PLT 158 08/18/2017 0651   MCV 67.9 (L) 08/18/2017 0651   MCH 21.3 (L) 08/18/2017 0651   MCHC 31.3 08/18/2017 0651   RDW 15.8 (H) 08/18/2017 0651   Urinalysis    Component Value Date/Time   COLORURINE YELLOW 08/18/2017 0657   APPEARANCEUR HAZY (A) 08/18/2017 0657   LABSPEC 1.021 08/18/2017 0657   PHURINE 6.0 08/18/2017 0657   GLUCOSEU NEGATIVE 08/18/2017 0657   HGBUR NEGATIVE 08/18/2017 0657   BILIRUBINUR NEGATIVE 08/18/2017 0657   KETONESUR NEGATIVE 08/18/2017 0657   PROTEINUR 100 (A) 08/18/2017 0657   NITRITE POSITIVE (A) 08/18/2017 0657   LEUKOCYTESUR TRACE (A) 08/18/2017 0657     Assessment/Plan: PPD#2 Febrile- probable UTI  Start on Keflex 500mg  qid Rev'd importance of reliable method of contraception- pt will consider LARC but not likely Reeval later this afternoon to assess for d/c today vs tomorrow    LOS: 2 days   Cam HaiSHAW, Leanah Kolander CNM 08/18/2017, 7:55 AM

## 2017-08-18 NOTE — Progress Notes (Signed)
Discharge instructions reviewed with patient. Patient instructed of medication changes and postpartum instructions. Paperwork reviewed and signed.

## 2017-08-20 LAB — URINE CULTURE

## 2018-09-25 ENCOUNTER — Emergency Department (HOSPITAL_COMMUNITY)
Admission: EM | Admit: 2018-09-25 | Discharge: 2018-09-25 | Disposition: A | Discharge status: Home / Self Care | Payer: Medicaid Other | Attending: Emergency Medicine | Admitting: Emergency Medicine

## 2018-09-25 DIAGNOSIS — J029 Acute pharyngitis, unspecified: Secondary | ICD-10-CM

## 2018-09-25 DIAGNOSIS — Z79899 Other long term (current) drug therapy: Secondary | ICD-10-CM | POA: Insufficient documentation

## 2018-09-25 LAB — GROUP A STREP BY PCR: Group A Strep by PCR: NOT DETECTED

## 2018-09-25 MED ORDER — ACETAMINOPHEN 500 MG PO TABS
1000.0000 mg | ORAL_TABLET | Freq: Once | ORAL | Status: AC
  Administered 2018-09-25: 1000 mg via ORAL
  Filled 2018-09-25: qty 2

## 2018-09-25 NOTE — ED Provider Notes (Signed)
[facility] EMERGENCY DEPARTMENT Provider Note   CSN: [id number] Arrival date & time: 09/25/18  7619     History   Chief Complaint No chief complaint on file.   HPI Jaylynn GLATHA THELANDER is a 23 y.o. female.  HPI   Pt is a 23 y/o female who presents to the ED today c/o sore throat that began 3 days ago. Sxs constant and severe. Pain worse on the left side. States that sxs feel similar to when patient last had strep throat. Has been able to drink water at home. Also with sone rhinorrhea and nasal congestion. No cough or sob. No fevers, chills, or body aches. Has tried no intervention for her sxs.  Past Medical History:  Diagnosis Date  . Medical history non-contributory     Patient Active Problem List   Diagnosis Date Noted  . UTI (urinary tract infection) 08/18/2017  . Meconium in amniotic fluid affecting management of mother, delivered 08/18/2017  . Amniotic fluid leaking 08/16/2017    Past Surgical History:  Procedure Laterality Date  . WISDOM TOOTH EXTRACTION       OB History    Gravida  1   Para  1   Term  1   Preterm      AB      Living  1     SAB      TAB      Ectopic      Multiple  0   Live Births  1            Home Medications    Prior to Admission medications   Medication Sig Start Date End Date Taking? Authorizing Provider  cephALEXin (KEFLEX) 500 MG capsule Take 1 capsule (500 mg total) by mouth 4 (four) times daily. 08/18/17   Tilda Burrow, MD  hydrocortisone cream 1 % Apply 1 application topically 2 (two) times daily.    [provider]  ibuprofen (ADVIL,MOTRIN) 100 MG/5ML suspension Take 30 mLs (600 mg total) by mouth every 6 (six) hours. 08/18/17   Tilda Burrow, MD  Prenatal Vit-Fe Fumarate-FA (PRENATAL MULTIVITAMIN) TABS tablet Take 1 tablet by mouth daily at 12 noon.    [provider]    Family History Family History  Problem Relation Age of Onset  . Diabetes Mother   .  Diabetes Father     Social History Social History   Tobacco Use  . Smoking status: Never Smoker  . Smokeless tobacco: Never Used  Substance Use Topics  . Alcohol use: No    Frequency: Never  . Drug use: No     Allergies   Patient has no known allergies.   Review of Systems Review of Systems  Constitutional: Negative for chills, diaphoresis and fever.  HENT: Positive for congestion, rhinorrhea and sore throat.   Eyes: Negative for visual disturbance.  Respiratory: Negative for cough and shortness of breath.   Cardiovascular: Negative for chest pain.  Gastrointestinal: Negative for abdominal pain, constipation, diarrhea, nausea and vomiting.  Genitourinary: Negative for dysuria.  Musculoskeletal: Negative for myalgias.  Skin: Negative for rash.  Neurological: Negative for headaches.     Physical Exam Updated Vital Signs BP 117/75   Pulse (!) 102   Temp 97.8 F (36.6 C) (Oral)   Resp 16   SpO2 98%   Physical Exam Vitals signs and nursing note reviewed.  Constitutional:      General: She is not in acute distress.    Appearance: She  is well-developed.  HENT:     Head: Normocephalic and atraumatic.     Nose: Nose normal.     Mouth/Throat:     Mouth: Mucous membranes are moist.     Comments: Pharyngeal erythema present. Tonsils are hypertrophic and mildly edematous. No exudate noted. Uvula midline. No evidence of PTA. Eyes:     Conjunctiva/sclera: Conjunctivae normal.     Pupils: Pupils are equal, round, and reactive to light.  Neck:     Musculoskeletal: Neck supple.  Cardiovascular:     Rate and Rhythm: Tachycardia present.  Pulmonary:     Effort: Pulmonary effort is normal.     Breath sounds: Normal breath sounds. No wheezing or rhonchi.  Abdominal:     General: Bowel sounds are normal.     Palpations: Abdomen is soft.     Tenderness: There is no guarding.  Musculoskeletal: Normal range of motion.  Lymphadenopathy:     Cervical: Cervical adenopathy  (mild) present.  Skin:    General: Skin is warm and dry.  Neurological:     Mental Status: She is alert.      ED Treatments / Results  Labs (all labs ordered are listed, but only abnormal results are displayed) Labs Reviewed  GROUP A STREP BY PCR    EKG None  Radiology No results found.  Procedures Procedures (including critical care time)  Medications Ordered in ED Medications  acetaminophen (TYLENOL) tablet 1,000 mg (1,000 mg Oral Given 09/25/18 0950)     Initial Impression / Assessment and Plan / ED Course  I have reviewed the triage vital signs and the nursing notes.  Pertinent labs & imaging results that were available during my care of the patient were reviewed by me and considered in my medical decision making (see chart for details).     Final Clinical Impressions(s) / ED Diagnoses   Final diagnoses:  Viral pharyngitis   Pt with sore throat that feels like when she last had strep throat. Tachycardic on arrival, but afebrile and otherwise vitals are normal.   Pt with pharyngeal erythema and tonsillar edema. No exudates noted. No evidence of PTA or deep space infection on exam. Mild cervical LAD.  Strep test is negative. Her symptoms are likely viral in etiology especially with her recent rhinorrhea and nasal congestion. Pt drinking water in the ED. She is nontoxic appears and in no acute distress. Her HR has improved after pain control and po intake. Feel that she is safe for discharge with close f/u. Specific return precautions were discussed. Pt voices understanding of the plan and reasons to return. All questions answered.  ED Discharge Orders    None       Rayne DuCouture, Sylvan Lahm S, PA-C 09/25/18 1054    Derwood KaplanNanavati, Ankit, MD 09/27/18 1629

## 2018-09-25 NOTE — Discharge Instructions (Addendum)
You may take tylenol and motrin for your symptoms. Gargle warm salt water for sore throat as well.   Please follow up with your primary care provider within 5-7 days for re-evaluation of your symptoms.   Return to the emergency room for new or worsening symptoms including persistent fevers, worsening sore throat, swollen tonsils, or inability to drink fluids.

## 2018-09-25 NOTE — ED Triage Notes (Signed)
Pt here from home with c/o sore throat since Monday , no fever , feels like last time she had strept

## 2018-09-25 NOTE — ED Notes (Signed)
Patient verbalizes understanding of discharge instructions. Opportunity for questioning and answers were provided. Armband removed by staff, pt discharged from ED. Ambulated out to lobby  

## 2020-03-31 ENCOUNTER — Other Ambulatory Visit: Payer: Medicaid Other

## 2022-06-27 ENCOUNTER — Other Ambulatory Visit: Payer: Self-pay

## 2022-06-27 ENCOUNTER — Emergency Department (HOSPITAL_COMMUNITY)
Admission: EM | Admit: 2022-06-27 | Discharge: 2022-06-27 | Disposition: A | Discharge status: Home / Self Care | Payer: Medicaid Other | Attending: Emergency Medicine | Admitting: Emergency Medicine

## 2022-06-27 ENCOUNTER — Encounter (HOSPITAL_COMMUNITY): Payer: Self-pay | Admitting: Emergency Medicine

## 2022-06-27 DIAGNOSIS — X500XXA Overexertion from strenuous movement or load, initial encounter: Secondary | ICD-10-CM | POA: Insufficient documentation

## 2022-06-27 DIAGNOSIS — M545 Low back pain, unspecified: Secondary | ICD-10-CM | POA: Diagnosis present

## 2022-06-27 DIAGNOSIS — G8911 Acute pain due to trauma: Secondary | ICD-10-CM | POA: Insufficient documentation

## 2022-06-27 DIAGNOSIS — Y990 Civilian activity done for income or pay: Secondary | ICD-10-CM | POA: Diagnosis not present

## 2022-06-27 LAB — POC URINE PREG, ED: Preg Test, Ur: NEGATIVE

## 2022-06-27 MED ORDER — LIDOCAINE 5 % EX PTCH
1.0000 | MEDICATED_PATCH | CUTANEOUS | Status: DC
  Administered 2022-06-27: 1 via TRANSDERMAL
  Filled 2022-06-27: qty 1

## 2022-06-27 MED ORDER — ACETAMINOPHEN 325 MG PO TABS
650.0000 mg | ORAL_TABLET | Freq: Once | ORAL | Status: DC
  Filled 2022-06-27: qty 2

## 2022-06-27 MED ORDER — KETOROLAC TROMETHAMINE 15 MG/ML IJ SOLN
15.0000 mg | Freq: Once | INTRAMUSCULAR | Status: AC
  Administered 2022-06-27: 15 mg via INTRAMUSCULAR
  Filled 2022-06-27: qty 1

## 2022-06-27 NOTE — Discharge Instructions (Addendum)
It was a pleasure taking care of you today.  As discussed, take over-the-counter ibuprofen or Tylenol as needed for your back pain.  I have included low back exercises.  Do daily.  You may purchase over-the-counter Lidoderm patches and Voltaren gel for added pain relief.  Follow-up with PCP if symptoms do not improve over the next few days.  Return to the ER for any worsening symptoms.

## 2022-06-27 NOTE — ED Triage Notes (Signed)
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

## 2022-06-27 NOTE — ED Provider Notes (Signed)
[place] EMERGENCY DEPARTMENT Provider Note   CSN: [id number] Arrival date & time: 06/27/22  4098     History  Chief Complaint  Patient presents with   Back Pain    Melody Kim is a 26 y.o. female with no significant past medical history who presents to the ED due to low back pain that started around 5 AM this morning.  Patient states she was lifting a heavy box and developed sudden onset of low back pain.  Denies saddle paresthesias, bowel/bladder incontinence, lower extremity numbness/tingling, lower extremity weakness.  No fever or chills.  No history of cancer.  Denies IV drug use. Denies direct trauma to back.  History obtained from patient and past medical records. No interpreter used during encounter.       Home Medications Prior to Admission medications   Medication Sig Start Date End Date Taking? Authorizing Provider  cephALEXin (KEFLEX) 500 MG capsule Take 1 capsule (500 mg total) by mouth 4 (four) times daily. 08/18/17   Jonnie Kind, MD  hydrocortisone cream 1 % Apply 1 application topically 2 (two) times daily.    [provider]  ibuprofen (ADVIL,MOTRIN) 100 MG/5ML suspension Take 30 mLs (600 mg total) by mouth every 6 (six) hours. 08/18/17   Jonnie Kind, MD  Prenatal Vit-Fe Fumarate-FA (PRENATAL MULTIVITAMIN) TABS tablet Take 1 tablet by mouth daily at 12 noon.    [provider]      Allergies    Patient has no known allergies.    Review of Systems   Review of Systems  Musculoskeletal:  Positive for back pain and gait problem.  Neurological:  Negative for weakness and numbness.  All other systems reviewed and are negative.   Physical Exam Updated Vital Signs BP 104/69   Pulse 84   Temp 98 F (36.7 C) (Oral)   Resp 18   LMP 09/11/2020 (Approximate)   SpO2 100%  Physical Exam Vitals and nursing note reviewed.  Constitutional:      General: She is not in acute distress.    Appearance: She is  not ill-appearing.  HENT:     Head: Normocephalic.  Eyes:     Pupils: Pupils are equal, round, and reactive to light.  Cardiovascular:     Rate and Rhythm: Normal rate and regular rhythm.     Pulses: Normal pulses.     Heart sounds: Normal heart sounds. No murmur heard.    No friction rub. No gallop.  Pulmonary:     Effort: Pulmonary effort is normal.     Breath sounds: Normal breath sounds.  Abdominal:     General: Abdomen is flat. There is no distension.     Palpations: Abdomen is soft.     Tenderness: There is no abdominal tenderness. There is no guarding or rebound.  Musculoskeletal:        General: Normal range of motion.     Cervical back: Neck supple.     Comments: No thoracic or lumbar midline tenderness.  Bilateral lower extremities neurovascularly intact.  Patient able to ambulate.  Skin:    General: Skin is warm and dry.  Neurological:     General: No focal deficit present.     Mental Status: She is alert.  Psychiatric:        Mood and Affect: Mood normal.        Behavior: Behavior normal.     ED Results / Procedures / Treatments   Labs (all labs ordered  are listed, but only abnormal results are displayed) Labs Reviewed  POC URINE PREG, ED    EKG None  Radiology No results found.  Procedures Procedures    Medications Ordered in ED Medications  lidocaine (LIDODERM) 5 % 1 patch (1 patch Transdermal Patch Applied 06/27/22 1155)  ketorolac (TORADOL) 15 MG/ML injection 15 mg (15 mg Intramuscular Given 06/27/22 1230)    ED Course/ Medical Decision Making/ A&P                           Medical Decision Making Risk Prescription drug management.   26 year old female presents to the ED due to low back pain after lifting a heavy box at work earlier this morning.  No direct injury.  Denies saddle paresthesias, bowel/bladder incontinence, lower extremity numbness/tingling, lower extremity weakness.  Upon arrival, vitals all within normal limits.  Patient  no acute distress.  Benign physical exam.  No thoracic or lumbar midline tenderness.  Bilateral lower extremities neurovascularly intact.  Patient able to ambulate in the ED.  Low suspicion for cauda equina or central cord compression.  Patient given Toradol with improvement in symptoms.  Patient unable to swallow pills.  Advised patient take over-the-counter liquid ibuprofen or Tylenol.  Low back exercises given to patient at discharge. Strict ED precautions discussed with patient. Patient states understanding and agrees to plan. Patient discharged home in no acute distress and stable vitals         Final Clinical Impression(s) / ED Diagnoses Final diagnoses:  Acute bilateral low back pain without sciatica    Rx / DC Orders ED Discharge Orders     None         Suzy Bouchard, PA-C 06/27/22 [place], [place], DO 06/27/22 1518

## 2022-12-22 ENCOUNTER — Encounter (HOSPITAL_COMMUNITY): Payer: Self-pay

## 2022-12-22 ENCOUNTER — Ambulatory Visit (HOSPITAL_COMMUNITY)
Admission: EM | Admit: 2022-12-22 | Discharge: 2022-12-22 | Disposition: A | Discharge status: Home / Self Care | Payer: Medicaid Other | Attending: Emergency Medicine | Admitting: Emergency Medicine

## 2022-12-22 ENCOUNTER — Ambulatory Visit (HOSPITAL_COMMUNITY)
Admission: EM | Admit: 2022-12-22 | Discharge: 2022-12-22 | Disposition: A | Discharge status: Home / Self Care | Payer: Medicaid Other | Source: Home / Self Care

## 2022-12-22 DIAGNOSIS — S29012A Strain of muscle and tendon of back wall of thorax, initial encounter: Secondary | ICD-10-CM | POA: Insufficient documentation

## 2022-12-22 DIAGNOSIS — F4323 Adjustment disorder with mixed anxiety and depressed mood: Secondary | ICD-10-CM | POA: Insufficient documentation

## 2022-12-22 DIAGNOSIS — S39012A Strain of muscle, fascia and tendon of lower back, initial encounter: Secondary | ICD-10-CM | POA: Insufficient documentation

## 2022-12-22 DIAGNOSIS — Z008 Encounter for other general examination: Secondary | ICD-10-CM | POA: Insufficient documentation

## 2022-12-22 DIAGNOSIS — Z789 Other specified health status: Secondary | ICD-10-CM

## 2022-12-22 LAB — POCT URINALYSIS DIPSTICK, ED / UC
Bilirubin Urine: NEGATIVE
Glucose, UA: NEGATIVE mg/dL
Hgb urine dipstick: NEGATIVE
Ketones, ur: NEGATIVE mg/dL
Nitrite: NEGATIVE
Protein, ur: NEGATIVE mg/dL
Specific Gravity, Urine: 1.025 (ref 1.005–1.030)
Urobilinogen, UA: 0.2 mg/dL (ref 0.0–1.0)
pH: 6 (ref 5.0–8.0)

## 2022-12-22 MED ORDER — IBUPROFEN 600 MG PO TABS: 600.0000 mg | ORAL_TABLET | Freq: Four times a day (QID) | ORAL | 0 refills | Status: DC | PRN

## 2022-12-22 MED ORDER — METHOCARBAMOL 500 MG PO TABS: 500.0000 mg | ORAL_TABLET | Freq: Two times a day (BID) | ORAL | 0 refills | Status: DC

## 2022-12-22 NOTE — Discharge Instructions (Addendum)
Your urine was negative for signs of infection.  I believe you have musculoskeletal strains due to your heavy lifting at work.  Please take the ibuprofen every 6 hours as needed, you can do warm compresses, warm baths with Epsom salt and gentle stretching as well.  If your pain persist, you can take a muscle relaxer as needed.  Do not drink or drive on this medication as it may make you drowsy.  Please ensure you are using proper lifting technique.  If your symptoms persist, I recommend establishing with a primary care provider for further evaluation and potential referral to a physical therapist.  You can contact [place] [facility] or go online to schedule.  Please return to clinic if you have any new or concerning symptoms.

## 2022-12-22 NOTE — ED Triage Notes (Signed)
Pt reports back pain for several days. Pt reports she does heavy lifting at work. Pt reports generalized body aches.

## 2022-12-22 NOTE — ED Provider Notes (Signed)
[facility]    CSN: [id number] Arrival date & time: 12/22/22  0909      History   Chief Complaint Chief Complaint  Patient presents with   Back Pain   Generalized Body Aches    HPI Melody Kim is a 27 y.o. female.    Reports body aches and back pain since Monday. Reports lower back discomfort and upper back tightness. Pain elicited in lower and upper back pain with lifting, reports her back 'catches.' She has not tried anything for the pain. Reports some nausea, denies abdominal pain, emesis or diarrhea. Denies recent illness. Denies numbness, tingling or incontinence. Reports pain came on gradually.  She works for [employer] and does a lot of heavy lifting.  Ambulatory with steady gait.  Last menses started April 1.  The history is provided by the patient and medical records.  Back Pain Associated symptoms: no chest pain, no dysuria, no fever and no weakness     Past Medical History:  Diagnosis Date   Medical history non-contributory     Patient Active Problem List   Diagnosis Date Noted   UTI (urinary tract infection) 08/18/2017   Meconium in amniotic fluid affecting management of mother, delivered 08/18/2017   Amniotic fluid leaking 08/16/2017    Past Surgical History:  Procedure Laterality Date   WISDOM TOOTH EXTRACTION      OB History     Gravida  1   Para  1   Term  1   Preterm      AB      Living  1      SAB      IAB      Ectopic      Multiple  0   Live Births  1            Home Medications    Prior to Admission medications   Medication Sig Start Date End Date Taking? Authorizing Provider  ibuprofen (ADVIL) 600 MG tablet Take 1 tablet (600 mg total) by mouth every 6 (six) hours as needed. 12/22/22  Yes Rinaldo Ratel, [place] N, FNP  methocarbamol (ROBAXIN) 500 MG tablet Take 1 tablet (500 mg total) by mouth 2 (two) times daily. 12/22/22  Yes Rinaldo Ratel, [place] N, FNP  Prenatal Vit-Fe Fumarate-FA (PRENATAL  MULTIVITAMIN) TABS tablet Take 1 tablet by mouth daily at 12 noon.   Yes [provider]  hydrocortisone cream 1 % Apply 1 application topically 2 (two) times daily.    [provider]    Family History Family History  Problem Relation Age of Onset   Diabetes Mother    Diabetes Father     Social History Social History   Tobacco Use   Smoking status: Never   Smokeless tobacco: Never  Substance Use Topics   Alcohol use: No   Drug use: No     Allergies   Patient has no known allergies.   Review of Systems Review of Systems  Constitutional:  Negative for chills, fatigue and fever.  HENT:  Negative for sore throat.   Respiratory:  Negative for cough and shortness of breath.   Cardiovascular:  Negative for chest pain.  Gastrointestinal:  Positive for nausea.  Genitourinary:  Negative for dysuria.  Musculoskeletal:  Positive for back pain. Negative for gait problem, neck pain and neck stiffness.  Neurological:  Negative for weakness.     Physical Exam Triage Vital Signs ED Triage Vitals [12/22/22 0957]  Enc Vitals Group  BP 111/69     Pulse Rate 70     Resp 16     Temp 98 F (36.7 C)     Temp Source Oral     SpO2 98 %     Weight      Height      Head Circumference      Peak Flow      Pain Score      Pain Loc      Pain Edu?      Excl. in GC?    No data found.  Updated Vital Signs BP 111/69 (BP Location: Left Arm)   Pulse 70   Temp 98 F (36.7 C) (Oral)   Resp 16   LMP 12/11/2022 (Approximate)   SpO2 98%   Visual Acuity Right Eye Distance:   Left Eye Distance:   Bilateral Distance:    Right Eye Near:   Left Eye Near:    Bilateral Near:     Physical Exam Vitals and nursing note reviewed.  Constitutional:      General: She is not in acute distress.    Appearance: She is well-developed.  HENT:     Head: Normocephalic and atraumatic.     Right Ear: External ear normal.     Left Ear: External ear normal.     Mouth/Throat:      Mouth: Mucous membranes are moist.  Eyes:     General: No scleral icterus.       Right eye: No discharge.        Left eye: No discharge.     Conjunctiva/sclera: Conjunctivae normal.  Cardiovascular:     Rate and Rhythm: Normal rate and regular rhythm.     Heart sounds: Normal heart sounds, S1 normal and S2 normal. No murmur heard. Pulmonary:     Effort: Pulmonary effort is normal. No respiratory distress.     Breath sounds: Normal breath sounds.     Comments: Lungs vesicular posteriorly. Abdominal:     Tenderness: There is right CVA tenderness. There is no left CVA tenderness.  Musculoskeletal:        General: No swelling, deformity or signs of injury. Normal range of motion.     Cervical back: Normal, normal range of motion and neck supple. No tenderness.     Thoracic back: Normal.     Lumbar back: Tenderness present.       Back:     Right lower leg: No edema.     Left lower leg: No edema.     Comments: Right-sided CVA tenderness to palpation.  Musculoskeletal thoracic back pain elicited with bending, flexion and extension.  Skin:    General: Skin is warm and dry.     Capillary Refill: Capillary refill takes less than 2 seconds.  Neurological:     Mental Status: She is alert and oriented to person, place, and time.  Psychiatric:        Mood and Affect: Mood normal.        Behavior: Behavior is cooperative.      UC Treatments / Results  Labs (all labs ordered are listed, but only abnormal results are displayed) Labs Reviewed  POCT URINALYSIS DIPSTICK, ED / UC - Abnormal; Notable for the following components:      Result Value   Leukocytes,Ua TRACE (*)    All other components within normal limits  URINE CULTURE    EKG   Radiology No results found.  Procedures Procedures (including critical care time)  Medications Ordered in UC Medications - No data to display  Initial Impression / Assessment and Plan / UC Course  I have reviewed the triage vital signs  and the nursing notes.  Pertinent labs & imaging results that were available during my care of the patient were reviewed by me and considered in my medical decision making (see chart for details).  Vitals in triage reviewed, patient is hemodynamically stable.  Patient with right-sided CVA tenderness on exam, reports some nausea, denies dysuria or odor.  Urinalysis in clinic positive for leukocytes, will send for culture to ensure no underlying UTI.   Suspect thoracic musculoskeletal strain and lumbar strain due to lifting with her job.  Strength 5 out of 5 in bilateral upper and lower extremities with sensation intact.  Denies numbness, tingling or cauda equina, and without red flag symptoms.  Discussed rest, heat, anti-inflammatories and muscle relaxers as needed.  Encouraged to follow-up with her primary care provider who can then refer further as needed.  Patient verbalized understanding, return follow-up precautions discussed.  No questions at this time.     Final Clinical Impressions(s) / UC Diagnoses   Final diagnoses:  Low back strain, initial encounter  Strain of mid-back, initial encounter     Discharge Instructions      Your urine was negative for signs of infection.  I believe you have musculoskeletal strains due to your heavy lifting at work.  Please take the ibuprofen every 6 hours as needed, you can do warm compresses, warm baths with Epsom salt and gentle stretching as well.  If your pain persist, you can take a muscle relaxer as needed.  Do not drink or drive on this medication as it may make you drowsy.  Please ensure you are using proper lifting technique.  If your symptoms persist, I recommend establishing with a primary care provider for further evaluation and potential referral to a physical therapist.  You can contact [place] [facility] or go online to schedule.  Please return to clinic if you have any new or concerning symptoms.      ED  Prescriptions     Medication Sig Dispense Auth. Provider   methocarbamol (ROBAXIN) 500 MG tablet Take 1 tablet (500 mg total) by mouth 2 (two) times daily. 20 tablet Rinaldo Ratel, [place] N, [place]   ibuprofen (ADVIL) 600 MG tablet Take 1 tablet (600 mg total) by mouth every 6 (six) hours as needed. 30 tablet Averi Cacioppo, [place] N, [place]      PDMP not reviewed this encounter.   Maryjayne Kleven, [place] N, [place] 12/22/22 1105

## 2022-12-22 NOTE — Discharge Instructions (Signed)
   Same-day appointments Operating hours and clinician availability may delay appointments until the next business day. [facility] [place] and Current Patients  Psychiatry hours Monday-Friday, 8am-4:30pm (Eastern Time) If you are experiencing problems accessing [facility] send email to: [email or web address] or call [phone], Monday-Friday, 8:00am-4:30pm If you are having a psychiatric or medical emergency, please call 911 or go to the nearest emergency department. To reach the Suicide and Crisis Lifeline, please call or text [phone].  What is [facility]?  [facility] is an online service that provides same-day access to psychiatry to meet urgent mental health needs. The goal is to provide patients with timely intervention and keep them in an outpatient setting.  How long will I wait to see a clinician?  Our goal is to provide same-day care. However, operating hours and clinician availability may delay appointments until the next business day.  What if I can't get an appointment?  Please call [facility] at [phone] 8am to 5pm [place] Time or email [place]:  [email or web address]  to request the next available time. If you are having a psychiatric or medical emergency, please call 911 or go to the nearest emergency department. To reach the Suicide and Crisis Lifeline, please call or text [phone]. Do you accept insurance?  Yes. We accept most commercial insurance plans. What information do you need from me? New patients should be ready to provide:  Photo ID  Insurance card  Payment information  For current patients, our specialists will confirm your documentation is on file.   Can I continue with regular care after my [facility] session?  Yes. Our goal is to make sure you have the follow-up care you need. Our specialist can help you schedule an appointment with an ongoing provider in addition to your [facility] session.  Can my current clinician provide  treatment on [facility]?  No. Our [facility] clinicians are trained to assist with more immediate needs and provide support between regular appointments with your clinician.  What device can I use to connect with [facility]?  You can use any Wi-Fi-enabled device with a camera and a microphone, such as a smartphone, tablet, or computer.  Do I have to be at home to connect with [facility]?  No. As long as you are located within [place] or [place] you can connect with a provider. We do request that you connect from a safe and private location. Your clinician is required to document your location for emergency purposes.

## 2022-12-22 NOTE — Progress Notes (Signed)
   12/22/22 1823  [facility]UC Triage Screening (Walk-ins at [facility] only)  How Did You Hear About [place]? Self  What Is the Reason for Your Visit/Call Today? Patient is a 27 year old female that presents this date requesting assistance with adjusting to "being with out her children." Patient reports she has two children ages 1 and 5 that she recently gave up to CPS after being "overwhelemed by everything being a single mother." Children are currently in the custody of their father. Patient denies any S/I, H/I or AVH and is requesting resources to assist with possibly counseling and parenting classes. Patient denies any SA issues.  How Long Has This Been Causing You Problems? 1 wk - 1 month  Have You Recently Had Any Thoughts About Hurting Yourself? No  Are You Planning to Commit Suicide/Harm Yourself At This time? No  Have you Recently Had Thoughts About Hurting Someone Melody Kim? No  Are You Planning To Harm Someone At This Time? No  Are you currently experiencing any auditory, visual or other hallucinations? No  Have You Used Any Alcohol or Drugs in the Past 24 Hours? No  Do you have any current medical co-morbidities that require immediate attention? No  Clinician description of patient physical appearance/behavior: Patient presents cooperative and pleasant  What Do You Feel Would Help You the Most Today?  (Counseling resources)  If access to [facility] Urgent Care was not available, would you have sought care in the Emergency Department? No  Determination of Need Routine (7 days)  Options For Referral Outpatient Therapy

## 2022-12-23 LAB — URINE CULTURE: Culture: <10000 — AB

## 2022-12-23 NOTE — ED Provider Notes (Signed)
Behavioral Health Urgent Care Medical Screening Exam  Patient Name: Melody Kim MRN: [id number] Date of Evaluation: 12/23/22 Chief Complaint:  " CPS worker informed me to get a mental health evaluation"  Diagnosis:  Final diagnoses:  Adjustment disorder with mixed anxiety and depressed mood  Need for community resource    History of Present illness: Melody Kim is a 27 y.o. female, presented to [facility] voluntarily requesting resources for mental health therapy.     Ryan Thomes Lolling, 27 y.o., female patient seen face to face by this provider, consulted with Dr. Lucianne Muss; and chart reviewed on 12/25/22.    On evaluation Melody Kim reports that she is recently relinquished custody of her 2 children, ages 1 and 5 to CPS due to being "overwhelmed as a single parent". Children are currently in the custody of their father. Per patient, CPS worker recommended that she receive a mental health evaluation as patient has a documented mental health diagnosis of depression and prior suicidal ideation. Patient reports receiving therapy and medication management in the past and feels that therapy and possibly medication would be beneficial. She denies SI/HI/AH/VH.   During evaluation Melody Kim is sitting upright in no acute distress.  She is alert, oriented x 4, calm, cooperative and attentive.  Her mood is euthymic with congruent affect.  She has normal speech, and behavior.  Objectively there is no evidence of psychosis/mania or delusional thinking.  Patient is able to converse coherently, goal directed thoughts, no distractibility, or pre-occupation.  She also denies suicidal/self-harm/homicidal ideation, psychosis, and paranoia.  Patient answered question appropriately.   Patient is able to contract for safety and will be provided outpatient resources for therapy and medication management.  Flowsheet Row ED from 12/22/2022 in [facility] Most recent reading at 12/22/2022  6:32 PM ED from 12/22/2022 in [facility] Urgent Care at [place] Most recent reading at 12/22/2022 10:08 AM ED from 06/27/2022 in [facility] Emergency Department at [facility] Most recent reading at 06/27/2022  7:00 AM  C-SSRS RISK CATEGORY No Risk No Risk No Risk       Psychiatric Specialty Exam  Presentation  General Appearance:Appropriate for Environment  Eye Contact:Fair  Speech:Clear and Coherent  Speech Volume:Normal  Handedness:No data recorded  Mood and Affect  Mood:Euthymic  Affect:Congruent   Thought Process  Thought Processes:Coherent  Descriptions of Associations:Intact  Orientation:Full (Time, Place and Person)  Thought Content:WDL    Hallucinations:None  Ideas of Reference:None  Suicidal Thoughts:No  Homicidal Thoughts:No   Sensorium  Memory:Immediate Good; Recent Good; Remote Good  Judgment:Fair  Insight:Fair   Executive Functions  Concentration:Fair  Attention Span:Fair  Recall:Good  Fund of Knowledge:Good  Language:Good   Psychomotor Activity  Psychomotor Activity:Normal   Assets  Assets:Financial Resources/Insurance; Manufacturing systems engineer; Physical Health; Transportation   Sleep  Sleep:Good  Number of hours: 6   Physical Exam: Physical Exam Vitals reviewed.  HENT:     Head: Normocephalic.     Right Ear: External ear normal.     Left Ear: External ear normal.     Nose: Nose normal.  Eyes:     Extraocular Movements: Extraocular movements intact.     Pupils: Pupils are equal, round, and reactive to light.  Cardiovascular:     Rate and Rhythm: Normal rate.  Pulmonary:     Effort: Pulmonary effort is normal.     Breath sounds: Normal breath sounds.  Musculoskeletal:        General:  Normal range of motion.     Cervical back: Normal range of motion.  Neurological:     General: No focal deficit present.     Mental Status: She is alert.    Review of  Systems  Psychiatric/Behavioral:  Positive for depression. Negative for hallucinations, memory loss, substance abuse and suicidal ideas. The patient is nervous/anxious. The patient does not have insomnia.    Blood pressure 107/69, pulse 82, temperature 98.2 F (36.8 C), temperature source Oral, resp. rate 18, height 4\' 11"  (1.499 m), weight 110 lb (49.9 kg), last menstrual period 12/11/2022, unknown if currently breastfeeding. Body mass index is 22.22 kg/m.    [facility] MSE Discharge Disposition for Follow up and Recommendations: Based on my evaluation the patient does not appear to have an emergency medical condition and can be discharged with resources and follow up care in outpatient services for Medication Management, Individual Therapy, and Group Therapy Return here to [facility] Outpatient clinic or [facility]  Joaquin Courts, NP 12/23/2022, 12:53 AM

## 2024-08-21 ENCOUNTER — Other Ambulatory Visit: Payer: Self-pay

## 2024-08-21 ENCOUNTER — Emergency Department (HOSPITAL_COMMUNITY)
Admission: EM | Admit: 2024-08-21 | Discharge: 2024-08-22 | Disposition: A | Discharge status: Other Acute Inpatient Hospital

## 2024-08-21 ENCOUNTER — Encounter (HOSPITAL_COMMUNITY): Payer: Self-pay | Admitting: Emergency Medicine

## 2024-08-21 DIAGNOSIS — R45851 Suicidal ideations: Secondary | ICD-10-CM | POA: Insufficient documentation

## 2024-08-21 DIAGNOSIS — F32A Depression, unspecified: Secondary | ICD-10-CM | POA: Insufficient documentation

## 2024-08-21 LAB — COMPREHENSIVE METABOLIC PANEL WITH GFR
ALT: 14 U/L (ref 0–44)
AST: 18 U/L (ref 15–41)
Albumin: 4.6 g/dL (ref 3.5–5.0)
Alkaline Phosphatase: 64 U/L (ref 38–126)
Anion gap: 17 — ABNORMAL HIGH (ref 5–15)
BUN: 14 mg/dL (ref 6–20)
CO2: 20 mmol/L — ABNORMAL LOW (ref 22–32)
Calcium: 9.1 mg/dL (ref 8.9–10.3)
Chloride: 103 mmol/L (ref 98–111)
Creatinine, Ser: 0.96 mg/dL (ref 0.44–1.00)
GFR, Estimated: >60 mL/min (ref >60)
Glucose, Bld: 72 mg/dL (ref 70–99)
Potassium: 3.5 mmol/L (ref 3.5–5.1)
Sodium: 140 mmol/L (ref 135–145)
Total Bilirubin: 0.3 mg/dL (ref 0.0–1.2)
Total Protein: 7.5 g/dL (ref 6.5–8.1)

## 2024-08-21 LAB — CBC
HCT: 42.7 % (ref 36.0–46.0)
Hemoglobin: 13.9 g/dL (ref 12.0–15.0)
MCH: 26.9 pg (ref 26.0–34.0)
MCHC: 32.6 g/dL (ref 30.0–36.0)
MCV: 82.6 fL (ref 80.0–100.0)
Platelets: 277 K/uL (ref 150–400)
RBC: 5.17 MIL/uL — ABNORMAL HIGH (ref 3.87–5.11)
RDW: 13 % (ref 11.5–15.5)
WBC: 10.7 K/uL — ABNORMAL HIGH (ref 4.0–10.5)
nRBC: 0 % (ref 0.0–0.2)

## 2024-08-21 LAB — ETHANOL: Alcohol, Ethyl (B): 63 mg/dL — ABNORMAL HIGH (ref <15)
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
## 2024-08-21 NOTE — ED Notes (Signed)
 Patient wanded by security in hallway after being moved back from triage. Pt already changed into hospitals scrubs.

## 2024-08-21 NOTE — ED Notes (Signed)
 Pt belongings was placed in locker 7, 2 bags.

## 2024-08-21 NOTE — ED Provider Notes (Signed)
 [place] EMERGENCY DEPARTMENT AT [facility] Provider Note   CSN: [id number] Arrival date & time: 08/21/24  2007     Patient presents with: V70.1   Melody Kim is a 28 y.o. female.   28 year old female presents for evaluation of suicidal ideation.  States she plans to cut her throat with a knife in her purse and just keep cutting.  She states she has had suicide attempts in the past.  States she has been feeling depressed for a while but it got worse today.  Denies any other symptoms or concerns.  She states she is supposed to be on medication for depression but only takes it as needed and does not take it regularly.        Prior to Admission medications  Medication Sig Start Date End Date Taking? Authorizing Provider  hydrocortisone cream 1 % Apply 1 application topically 2 (two) times daily.    [provider]  ibuprofen  (ADVIL ) 600 MG tablet Take 1 tablet (600 mg total) by mouth every 6 (six) hours as needed. 12/22/22   Dreama, [place]  N, FNP  methocarbamol  (ROBAXIN ) 500 MG tablet Take 1 tablet (500 mg total) by mouth 2 (two) times daily. 12/22/22   Dreama, [place]  N, FNP  Prenatal Vit-Fe Fumarate-FA (PRENATAL MULTIVITAMIN) TABS tablet Take 1 tablet by mouth daily at 12 noon.    [provider]    Allergies: Patient has no known allergies.    Review of Systems  Constitutional:  Negative for chills and fever.  HENT:  Negative for ear pain and sore throat.   Eyes:  Negative for pain and visual disturbance.  Respiratory:  Negative for cough and shortness of breath.   Cardiovascular:  Negative for chest pain and palpitations.  Gastrointestinal:  Negative for abdominal pain and vomiting.  Genitourinary:  Negative for dysuria and hematuria.  Musculoskeletal:  Negative for arthralgias and back pain.  Skin:  Negative for color change and rash.  Neurological:  Negative for seizures and syncope.  Psychiatric/Behavioral:  Positive for  dysphoric mood and suicidal ideas. Negative for self-injury.   All other systems reviewed and are negative.   Updated Vital Signs BP 109/68 (BP Location: Right Arm)   Pulse 98   Temp 98.6 F (37 C) (Oral)   Resp 16   Ht 4' 11 (1.499 m)   Wt 55.8 kg   SpO2 99%   BMI 24.84 kg/m   Physical Exam Vitals and nursing note reviewed.  Constitutional:      General: She is not in acute distress.    Appearance: She is well-developed.  HENT:     Head: Normocephalic and atraumatic.  Eyes:     Conjunctiva/sclera: Conjunctivae normal.  Cardiovascular:     Rate and Rhythm: Normal rate and regular rhythm.     Heart sounds: No murmur heard. Pulmonary:     Effort: Pulmonary effort is normal. No respiratory distress.     Breath sounds: Normal breath sounds.  Abdominal:     Palpations: Abdomen is soft.     Tenderness: There is no abdominal tenderness.  Musculoskeletal:        General: No swelling.     Cervical back: Neck supple.  Skin:    General: Skin is warm and dry.     Capillary Refill: Capillary refill takes less than 2 seconds.  Neurological:     Mental Status: She is alert.  Psychiatric:     Comments: Flat affect because mom does admit to suicidal  ideation, denies homicidal ideation, or delusions or hearing voices     (all labs ordered are listed, but only abnormal results are displayed) Labs Reviewed  CBC - Abnormal; Notable for the following components:      Result Value   WBC 10.7 (*)    RBC 5.17 (*)    All other components within normal limits  COMPREHENSIVE METABOLIC PANEL WITH GFR  ETHANOL  URINE DRUG SCREEN  PREGNANCY, URINE  URINALYSIS, ROUTINE W REFLEX MICROSCOPIC  POC URINE PREG, ED    EKG: None  Radiology: No results found.   Procedures   Medications Ordered in the ED - No data to display                                  Medical Decision Making Social determinants of health: Patient noncompliant with her medication regimen, history of  depression  Patient here for suicidal ideations without active plan.  Has had suicide attempts in the past.  Not taking her medications as prescribed.  Patient is placed on IVC papers.  When she is medically cleared and psychiatry/TOC consult will be placed.  Problems Addressed: Suicidal ideation: undiagnosed new problem with uncertain prognosis  Amount and/or Complexity of Data Reviewed External Data Reviewed: notes.    Details: Prior ED records reviewed and patient seen for 12-24 for adjustment disorder Labs: ordered. Decision-making details documented in ED Course.    Details: Ordered and pending ECG/medicine tests: ordered and independent interpretation performed. Decision-making details documented in ED Course.    Details: Ordered and interpreted by me in the absence of cardiology and shows sinus rhythm, no STEMI, or significant change when compared to prior EKG  Risk OTC drugs. Prescription drug management. Decision regarding hospitalization. Diagnosis or treatment significantly limited by social determinants of health.     Final diagnoses:  Suicidal ideation    ED Discharge Orders     None          Gennaro Duwaine CROME, DO 08/21/24 2222

## 2024-08-21 NOTE — BH Assessment (Incomplete)
 Comprehensive Clinical Assessment (CCA) Note  08/21/2024 Melody Kim [id number]  Chief Complaint:  Chief Complaint  Patient presents with   V70.1  Disposition: Per *** patient is recommended for inpatient admission/discharge with outpatient follow-up/ overnight observation with re-evaluation in the morning.  Disposition SW to pursue appropriate inpatient options.  The patient demonstrates the following risk factors for suicide: Chronic risk factors for suicide include: {Chronic Risk Factors for Dlprpiz:69585988}. Acute risk factors for suicide include: {Acute Risk Factors for Dlprpiz:69585987}. Protective factors for this patient include: {Protective Factors for Suicide Mpdx:69585986}. Considering these factors, the overall suicide risk at this point appears to be {Desc; low/moderate/high:110033}. Patient {ACTION; IS/IS WNU:78978602} appropriate for outpatient follow up.  Patient is a 28 year old female with a history of MDD who presents voluntarily to [facility] for an assessment. Patient resides in the home with her ex-boyfriend and his father. Patient reports isolation, crying spells, irritability, hopelessness, guilt, loss of interest to do things they enjoy, fatigue, lack of concentration, worthlessness, change in sleep, and change in appetite. Patient reports history of past suicide attempts, last occurrence was 2 days ago by drinking bleach, but states she didn't drink enough.  Patient denies HI, Visual Hallucinations.  Patient identifies his primary stressors as ongoing issues with an ex-girlfriend, his friend committed suicide in November, and ongoing struggle with online school.  Patient denies history of abuse or trauma. Patient denies current legal problems. Patient is receiving outpatient therapy once a week virtually and states he is not established  with psychiatry services or prescribed any medication for his symptoms.Patient denies previous inpatient admissions. Patient denies  access to weapons.   Patient is unable to verbally contract for safety outside of the hospital. Treatment options were discussed and patient is in agreement with recommendation for inpatient admission.   During evaluation patient is in no acute distress. He is alert, oriented x 4, calm, cooperative and attentive. His mood is anxious and depressed with congruent affect. He has normal speech, and behavior.  Patient reports auditory hallucinations hearing voices telling him to die earlier tonight, but denies currently. Patient appears to be fixated on ex-girlfriend due to finding out in November that she is dating someone else. Patient is able to converse coherently, goal directed thoughts, no distractibility, or pre-occupation at this time.  Patient answered question appropriately.      Visit Diagnosis: ***    CCA Screening, Triage and Referral (STR)  Patient Reported Information How did you hear about us ? No data recorded What Is the Reason for Your Visit/Call Today? No data recorded How Long Has This Been Causing You Problems? No data recorded What Do You Feel Would Help You the Most Today? No data recorded  Have You Recently Had Any Thoughts About Hurting Yourself? No data recorded Are You Planning to Commit Suicide/Harm Yourself At This time? No data recorded  Flowsheet Row ED from 08/21/2024 in [facility] Emergency Department at [facility] Most recent reading at 08/21/2024  8:46 PM ED from 12/22/2022 in [facility] Most recent reading at 12/22/2022  6:32 PM UC from 12/22/2022 in [facility] at [facility] Most recent reading at 12/22/2022 10:08 AM  C-SSRS RISK CATEGORY High Risk No Risk No Risk    Have you Recently Had Thoughts About Hurting Someone [place]? No data recorded Are You Planning to Harm Someone at This Time? No data recorded Explanation: No data recorded  Have You Used Any Alcohol or Drugs in the Past 24  Hours? No data  recorded How Long Ago Did You Use Drugs or Alcohol? No data recorded What Did You Use and How Much? No data recorded  Do You Currently Have a Therapist/Psychiatrist? No data recorded Name of Therapist/Psychiatrist:    Have You Been Recently Discharged From Any Office Practice or Programs? No data recorded Explanation of Discharge From Practice/Program: No data recorded    CCA Screening Triage Referral Assessment Type of Contact: No data recorded Telemedicine Service Delivery:   Is this Initial or Reassessment?   Date Telepsych consult ordered in [facility]:    Time Telepsych consult ordered in [facility]:    Location of Assessment: No data recorded Provider Location: No data recorded  Collateral Involvement: No data recorded  Does Patient Have a Court Appointed Legal Guardian? No data recorded Legal Guardian Contact Information: No data recorded Copy of Legal Guardianship Form: No data recorded Legal Guardian Notified of Arrival: No data recorded Legal Guardian Notified of Pending Discharge: No data recorded If Minor and Not Living with Parent(s), Who has Custody? No data recorded Is CPS involved or ever been involved? No data recorded Is APS involved or ever been involved? No data recorded  Patient Determined To Be At Risk for Harm To Self or Others Based on Review of Patient Reported Information or Presenting Complaint? No data recorded Method: No data recorded Availability of Means: No data recorded Intent: No data recorded Notification Required: No data recorded Additional Information for Danger to Others Potential: No data recorded Additional Comments for Danger to Others Potential: No data recorded Are There Guns or Other Weapons in Your Home? No data recorded Types of Guns/Weapons: No data recorded Are These Weapons Safely Secured?                            No data recorded Who Could Verify You Are Able To Have These Secured: No data recorded Do You Have any Outstanding Charges,  Pending Court Dates, Parole/Probation? No data recorded Contacted To Inform of Risk of Harm To Self or Others: No data recorded   Does Patient Present under Involuntary Commitment? No data recorded   [place] of Residence: No data recorded  Patient Currently Receiving the Following Services: No data recorded  Determination of Need: No data recorded  Options For Referral: No data recorded    CCA Biopsychosocial Patient Reported Schizophrenia/Schizoaffective Diagnosis in Past: No data recorded  Strengths: No data recorded  Mental Health Symptoms Depression:  No data recorded  Duration of Depressive symptoms:    Mania:  No data recorded  Anxiety:   No data recorded  Psychosis:  No data recorded  Duration of Psychotic symptoms:    Trauma:  No data recorded  Obsessions:  No data recorded  Compulsions:  No data recorded  Inattention:  No data recorded  Hyperactivity/Impulsivity:  No data recorded  Oppositional/Defiant Behaviors:  No data recorded  Emotional Irregularity:  No data recorded  Other Mood/Personality Symptoms:  No data recorded   Mental Status Exam Appearance and self-care  Stature:  No data recorded  Weight:  No data recorded  Clothing:  No data recorded  Grooming:  No data recorded  Cosmetic use:  No data recorded  Posture/gait:  No data recorded  Motor activity:  No data recorded  Sensorium  Attention:  No data recorded  Concentration:  No data recorded  Orientation:  No data recorded  Recall/memory:  No data recorded  Affect and Mood  Affect:  No data recorded  Mood:  No data recorded  Relating  Eye contact:  No data recorded  Facial expression:  No data recorded  Attitude toward examiner:  No data recorded  Thought and Language  Speech flow: No data recorded  Thought content:  No data recorded  Preoccupation:  No data recorded  Hallucinations:  No data recorded  Organization:  No data recorded  [employer] of Knowledge:  No data  recorded  Intelligence:  No data recorded  Abstraction:  No data recorded  Judgement:  No data recorded  Reality Testing:  No data recorded  Insight:  No data recorded  Decision Making:  No data recorded  Social Functioning  Social Maturity:  No data recorded  Social Judgement:  No data recorded  Stress  Stressors:  No data recorded  Coping Ability:  No data recorded  Skill Deficits:  No data recorded  Supports:  No data recorded    Religion:    Leisure/Recreation:    Exercise/Diet:     CCA Employment/Education Employment/Work Situation:    Education:     CCA Family/Childhood History Family and Relationship History:    Childhood History:          CCA Substance Use Alcohol/Drug Use:                           ASAM's:  Six Dimensions of Multidimensional Assessment  Dimension 1:  Acute Intoxication and/or Withdrawal Potential:      Dimension 2:  Biomedical Conditions and Complications:      Dimension 3:  Emotional, Behavioral, or Cognitive Conditions and Complications:     Dimension 4:  Readiness to Change:     Dimension 5:  Relapse, Continued use, or Continued Problem Potential:     Dimension 6:  Recovery/Living Environment:     ASAM Severity Score:    ASAM Recommended Level of Treatment:     Substance use Disorder (SUD)    Recommendations for Services/Supports/Treatments:    Disposition Recommendation per psychiatric provider: {CHLmaccldispo:31820}   DSM5 Diagnoses: Patient Active Problem List   Diagnosis Date Noted   UTI (urinary tract infection) 08/18/2017   Meconium in amniotic fluid affecting management of mother, delivered 08/18/2017   Amniotic fluid leaking 08/16/2017     Referrals to Alternative Service(s): Referred to Alternative Service(s):   Place:   Date:   Time:    Referred to Alternative Service(s):   Place:   Date:   Time:    Referred to Alternative Service(s):   Place:   Date:   Time:    Referred to  Alternative Service(s):   Place:   Date:   Time:     Carnita Golob C Ted Leonhart, LCMHCA

## 2024-08-21 NOTE — ED Triage Notes (Addendum)
 Pt bib [employer] under Emergency Commitment after she made statements of wanting to harm herself d/t a relationship issue. Per [employer] officer, pt states she would get a knife and cut her throat. Pt currently endorses SI. Pt states she has been prescribed Paxil and Buspar for her depression but that she does not take them.

## 2024-08-22 ENCOUNTER — Inpatient Hospital Stay (HOSPITAL_COMMUNITY): Admission: AD | Admit: 2024-08-22 | Discharge: 2024-08-26 | DRG: 885 | Disposition: A | Source: Intra-hospital

## 2024-08-22 DIAGNOSIS — R45851 Suicidal ideations: Secondary | ICD-10-CM | POA: Diagnosis present

## 2024-08-22 DIAGNOSIS — F331 Major depressive disorder, recurrent, moderate: Principal | ICD-10-CM | POA: Diagnosis present

## 2024-08-22 DIAGNOSIS — Z79899 Other long term (current) drug therapy: Secondary | ICD-10-CM | POA: Diagnosis not present

## 2024-08-22 DIAGNOSIS — G4700 Insomnia, unspecified: Secondary | ICD-10-CM | POA: Diagnosis present

## 2024-08-22 DIAGNOSIS — Z91411 Personal history of adult psychological abuse: Secondary | ICD-10-CM | POA: Diagnosis not present

## 2024-08-22 DIAGNOSIS — Z9151 Personal history of suicidal behavior: Secondary | ICD-10-CM | POA: Diagnosis not present

## 2024-08-22 DIAGNOSIS — F411 Generalized anxiety disorder: Secondary | ICD-10-CM | POA: Diagnosis present

## 2024-08-22 DIAGNOSIS — Z833 Family history of diabetes mellitus: Secondary | ICD-10-CM | POA: Diagnosis not present

## 2024-08-22 DIAGNOSIS — Z59868 Other specified financial insecurity: Secondary | ICD-10-CM | POA: Diagnosis not present

## 2024-08-22 DIAGNOSIS — Z91148 Patient's other noncompliance with medication regimen for other reason: Secondary | ICD-10-CM | POA: Diagnosis not present

## 2024-08-22 LAB — URINE DRUG SCREEN
Amphetamines: NEGATIVE
Barbiturates: NEGATIVE
Benzodiazepines: NEGATIVE
Cocaine: NEGATIVE
Fentanyl: NEGATIVE
Methadone Scn, Ur: NEGATIVE
Opiates: NEGATIVE
Tetrahydrocannabinol: NEGATIVE

## 2024-08-22 LAB — URINALYSIS, ROUTINE W REFLEX MICROSCOPIC
Bilirubin Urine: NEGATIVE
Glucose, UA: NEGATIVE mg/dL
Ketones, ur: 20 mg/dL — AB
Nitrite: NEGATIVE
Protein, ur: 30 mg/dL — AB
Specific Gravity, Urine: 1.029 (ref 1.005–1.030)
pH: 5 (ref 5.0–8.0)

## 2024-08-22 LAB — PREGNANCY, URINE: Preg Test, Ur: NEGATIVE

## 2024-08-22 MED ORDER — HALOPERIDOL 5 MG PO TABS: 5.0000 mg | ORAL_TABLET | Freq: Three times a day (TID) | ORAL | Status: DC | PRN

## 2024-08-22 MED ORDER — BUSPIRONE HCL 5 MG PO TABS
5.0000 mg | ORAL_TABLET | Freq: Two times a day (BID) | ORAL | Status: DC
  Administered 2024-08-22 – 2024-08-26 (×8): 5 mg via ORAL
  Filled 2024-08-22 (×7): qty 1
  Filled 2024-08-22: qty 10
  Filled 2024-08-22: qty 1

## 2024-08-22 MED ORDER — ACETAMINOPHEN 325 MG PO TABS: 650.0000 mg | ORAL_TABLET | Freq: Four times a day (QID) | ORAL | Status: DC | PRN

## 2024-08-22 MED ORDER — ALUM & MAG HYDROXIDE-SIMETH 200-200-20 MG/5ML PO SUSP: 30.0000 mL | ORAL | Status: DC | PRN

## 2024-08-22 MED ORDER — FLUOXETINE HCL 10 MG PO CAPS
10.0000 mg | ORAL_CAPSULE | Freq: Every day | ORAL | Status: DC
  Administered 2024-08-22 – 2024-08-26 (×5): 10 mg via ORAL
  Filled 2024-08-22 (×5): qty 1
  Filled 2024-08-22: qty 5

## 2024-08-22 MED ORDER — LORAZEPAM 2 MG/ML IJ SOLN: 2.0000 mg | Freq: Three times a day (TID) | INTRAMUSCULAR | Status: DC | PRN

## 2024-08-22 MED ORDER — HYDROXYZINE HCL 25 MG PO TABS: 25.0000 mg | ORAL_TABLET | Freq: Three times a day (TID) | ORAL | Status: DC | PRN

## 2024-08-22 MED ORDER — DIPHENHYDRAMINE HCL 50 MG/ML IJ SOLN: 50.0000 mg | Freq: Three times a day (TID) | INTRAMUSCULAR | Status: DC | PRN

## 2024-08-22 MED ORDER — DIPHENHYDRAMINE HCL 25 MG PO CAPS: 50.0000 mg | ORAL_CAPSULE | Freq: Three times a day (TID) | ORAL | Status: DC | PRN

## 2024-08-22 MED ORDER — HALOPERIDOL LACTATE 5 MG/ML IJ SOLN: 10.0000 mg | Freq: Three times a day (TID) | INTRAMUSCULAR | Status: DC | PRN

## 2024-08-22 MED ORDER — HYDROXYZINE HCL 50 MG PO TABS
50.0000 mg | ORAL_TABLET | Freq: Every day | ORAL | Status: DC
  Administered 2024-08-22 – 2024-08-25 (×4): 50 mg via ORAL
  Filled 2024-08-22 (×2): qty 1
  Filled 2024-08-22: qty 5
  Filled 2024-08-22 (×2): qty 1

## 2024-08-22 MED ORDER — MAGNESIUM HYDROXIDE 400 MG/5ML PO SUSP: 30.0000 mL | Freq: Every day | ORAL | Status: DC | PRN

## 2024-08-22 MED ORDER — HALOPERIDOL LACTATE 5 MG/ML IJ SOLN: 5.0000 mg | Freq: Three times a day (TID) | INTRAMUSCULAR | Status: DC | PRN

## 2024-08-22 NOTE — Group Note (Signed)
 Date:  08/22/2024 Time:  7:27 PM  Group Topic/Focus: Pictionary  A mental-health-friendly Pictionary game should use calm, positive, and non-triggering prompts--such as simple objects (sun, tree, cup), self-care activities (sleeping, listening to music, gardening), and uplifting concepts (hope, balance, friendship)--to support relaxation, confidence, and social connection. Choose very easy drawings, avoid competitive pressure by removing timers, and allow teamwork or collaborative drawing to reduce anxiety. Keeping prompts gentle and familiar helps patients engage comfortably, while the creative activity itself encourages emotional expression, focus, and a sense of safety.    Participation Level:  Did Not Attend  Melody Kim 08/22/2024, 7:27 PM
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
## 2024-08-22 NOTE — Progress Notes (Signed)
 Patient is accepted to [facility] Pending med clearance labs. At the time of this note they have not yet resulted. Patients' nurse informed.

## 2024-08-22 NOTE — ED Notes (Signed)
 [facility] called to transport patient to [facility]. Nurse aware.

## 2024-08-22 NOTE — Group Note (Signed)
 Date:  08/22/2024 Time:  4:35 PM  Group Topic/Focus:  Self Care:   The focus of this group is to help patients understand the importance of self-care and sleep hygiene in order to improve or restore emotional, physical, spiritual, interpersonal, and financial health.    Participation Level:  Active  Participation Quality:  Appropriate, Attentive, and Sharing  Affect:  Appropriate  Cognitive:  Alert, Appropriate, and Oriented  Insight: Good  Engagement in Group:  Engaged  Modes of Intervention:  Discussion   Melody Kim Sharps 08/22/2024, 4:35 PM

## 2024-08-22 NOTE — ED Notes (Signed)
 Writer attempted to call report to [facility], receiving RN busy at the moment and states she will call writer back once she is free.

## 2024-08-22 NOTE — Progress Notes (Signed)
 Mercie VEAR Banana, RN, 08/22/2024, Time of arrival: 9:45AM  Patient is a new admit to unit. Patient is involuntary. Patient stated reason for admission/reason for being here was due to increased depression after experiencing a relationship break up after thanksgiving. Patient says her current stressors includes The fact that I don't have my kids. The fact that I have these problem with my family. Patient says she was having suicidal thoughts and planned to use a pocket knife to cut my throat. Twist it around. Verbal contract was initiated. Patient reports having a previous SI attempt back in December of 2022. Patient goal during this admission is to understand Why I'm like this.  Patient belongings addressed and stored. Skin check performed with two staff, results unremarkable. Vital signs unremarkable. No reported or observed physiological concerns or abnormalities. Patient engaged with assessment with encouragement. Patient orientated to facility, unit and room. All questions and concerns addressed at this time.

## 2024-08-22 NOTE — Group Note (Signed)
 Recreation Therapy Group Note   Group Topic:Leisure Education  Group Date: 08/22/2024 Start Time: 0930 End Time: 1002 Facilitators: Rhayne Chatwin-McCall, LRT,CTRS Location: [address] Dayroom  Group Topic: Leisure Education   Goal Area(s) Addresses:  Patient will successfully identify positive leisure and recreation activities.  Patient will acknowledge benefits of participation in healthy leisure activities post discharge.  Patient will actively work with peers toward a shared goal.   Behavioral Response:    Intervention: Competitive Group Game   Activity: Guess the [employer]. The game is divided into 6 categories (Pop, R&B, Rock, Hip Hop, Dance and Indie). In teams of 3-4, patients took turns spinning the wheel. Whatever category the needle landed on, the group would pull a card from that stack. One of group members reads the lyric to their group. Their group has to guess the missing lyric. If they get the correct answer, they keep the card. If not, the other team gets the chance to steal the point. The team with the most cards at the end wins the game.   Education: Teacher, English As A Foreign Language, Stress Management, Discharge Planning  Education Outcome: Acknowledges education/In group clarification offered/Needs additional education   Affect/Mood: N/A   Participation Level: Did not attend    Clinical Observations/Individualized Feedback:      Plan: Continue to engage patient in RT group sessions 2-3x/week.   Xochil Shanker-McCall, LRT,CTRS 08/22/2024 11:37 AM

## 2024-08-22 NOTE — ED Notes (Signed)
 Findings & Custody uploaded

## 2024-08-22 NOTE — ED Provider Notes (Signed)
°  Physical Exam  BP 109/68 (BP Location: Right Arm)   Pulse 98   Temp 98.6 F (37 C) (Oral)   Resp 16   Ht 4' 11 (1.499 m)   Wt 55.8 kg   SpO2 99%   BMI 24.84 kg/m   Physical Exam Vitals and nursing note reviewed.  Constitutional:      Appearance: Normal appearance.  Cardiovascular:     Rate and Rhythm: Normal rate.  Pulmonary:     Effort: Pulmonary effort is normal.  Skin:    General: Skin is warm and dry.  Neurological:     Mental Status: She is alert and oriented to person, place, and time.     Procedures  Procedures  ED Course / MDM    Medical Decision Making Amount and/or Complexity of Data Reviewed Labs: ordered.   Care assumed at shift change from previous provider.  Patient awaiting results of laboratory studies.  Laboratory studies have all returned and are basically unremarkable.  Patient medically cleared for TTS evaluation.       Geroldine Berg, MD 08/22/24 [phone]

## 2024-08-22 NOTE — Progress Notes (Signed)
°   08/22/24 2129  Psych Admission Type (Psych Patients Only)  Admission Status Involuntary  Psychosocial Assessment  Patient Complaints None  Eye Contact Fair  Facial Expression Flat  Affect Appropriate to circumstance  Speech Logical/coherent  Interaction Assertive  Motor Activity Other (Comment) (WDL)  Appearance/Hygiene In scrubs  Behavior Characteristics Appropriate to situation  Mood Pleasant  Thought Process  Coherency WDL  Content WDL  Delusions None reported or observed  Perception WDL  Hallucination None reported or observed  Judgment Poor  Confusion None  Danger to Self  Current suicidal ideation? Passive  Self-Injurious Behavior No self-injurious ideation or behavior indicators observed or expressed   Agreement Not to Harm Self Yes  Description of Agreement verbal  Danger to Others  Danger to Others None reported or observed

## 2024-08-22 NOTE — Group Note (Signed)
 Date:  08/22/2024 Time:  9:49 PM  Group Topic/Focus:  Wrap-Up Group:   The focus of this group is to help patients review their daily goal of treatment and discuss progress on daily workbooks.    Participation Level:  Active  Participation Quality:  Appropriate  Affect:  Appropriate  Cognitive:  Appropriate  Insight: Improving  Engagement in Group:  Engaged  Modes of Intervention:  Discussion  Additional Comments:  Client attended group which was led by members of [employer]. Pt shared their story and some of their challenges.  Melody Kim 08/22/2024, 9:49 PM

## 2024-08-22 NOTE — ED Notes (Signed)
 IVC E-FILED TO MAGISTRATE AND UPLOADED TO CHART

## 2024-08-22 NOTE — Plan of Care (Signed)

## 2024-08-22 NOTE — ED Notes (Signed)
 Report called to Melissa at [facility]

## 2024-08-22 NOTE — Group Note (Signed)
 Date:  08/22/2024 Time:  1:00 PM  Group Topic/Focus: Social wellness Wellness Toolbox:   The focus of this group is to discuss various aspects of wellness, balancing those aspects and exploring ways to increase the ability to experience wellness.  Patients will create a wellness toolbox for use upon discharge.    Participation Level:  Active  Participation Quality:  Appropriate  Affect:  Appropriate  Cognitive:  Alert and Appropriate  Insight: Appropriate  Engagement in Group:  Engaged  Modes of Intervention:  Discussion  Additional Comments:    Melody Kim 08/22/2024, 1:00 PM

## 2024-08-22 NOTE — Group Note (Signed)
 Date:  08/22/2024 Time:  10:23 AM  Group Topic/Focus: recreational therapy played finished the lyric  Guess the Lyrics can be a fun and effective tool for mental health and critical thinking by engaging individuals with emotional content, boosting memory, and fostering empathy. By guessing missing lyrics from songs that address themes like resilience, love, or struggle, participants can connect with their own emotions, reflect on their personal experiences, and practice cognitive flexibility. Discussing the meaning of the lyrics afterward encourages deeper self-awareness and perspective-taking, promoting mental well-being. This activity also offers a creative outlet for self-expression and can serve as a conversation starter on important topics like vulnerability, hope, and personal growth.    Participation Level:  Did Not Attend   Melody Kim 08/22/2024, 10:23 AM

## 2024-08-22 NOTE — Tx Team (Signed)
 Initial Treatment Plan 08/22/2024 2:56 PM Daisa DODY SMARTT FMW:[id number]    PATIENT STRESSORS: Marital or family conflict   Medication change or noncompliance     PATIENT STRENGTHS: Ability for insight  Active sense of humor  Average or above average intelligence  Communication skills  Work skills    PATIENT IDENTIFIED PROBLEMS: RELATIONSHIP BREAKUP     FAMILY CONFLICT: The fact that I don't ave my kids. The fact that I have these family problems.                  DISCHARGE CRITERIA:  Ability to meet basic life and health needs Improved stabilization in mood, thinking, and/or behavior Motivation to continue treatment in a less acute level of care Need for constant or close observation no longer present Reduction of life-threatening or endangering symptoms to within safe limits Verbal commitment to aftercare and medication compliance  PRELIMINARY DISCHARGE PLAN: Attend aftercare/continuing care group Return to previous living arrangement Return to previous work or school arrangements  PATIENT/FAMILY INVOLVEMENT: This treatment plan has been presented to and reviewed with the patient, Chenelle S Miedema. The patient have been given the opportunity to ask questions and make suggestions.  Mercie VEAR Banana, RN 08/22/2024, 2:56 PM

## 2024-08-22 NOTE — [facility] Suicide Risk Assessment (Signed)
 Suicide Risk Assessment  Admission Assessment    [facility] Admission Suicide Risk Assessment   Nursing information obtained from:     Demographic factors: African-American female.  Current Mental Status: Alert, oriented & aware of situation.  Loss Factors:  Relationship break-up a day after thanksgiving.  Historical Factors:  Previous Psychiatric hospitalization at the [facility] health for putting a gun to her head, medication non-compliance.  Risk Reduction Factors:     Total Time spent with patient: 1.5 hours including H&P.  Principal Problem: Major depressive disorder, recurrent episode, moderate (HCC)  Diagnosis:  Principal Problem:   Major depressive disorder, recurrent episode, moderate (HCC) Active Problems:   Suicidal ideations   Generalized anxiety disorder  Subjective Data: See H&P.  Continued Clinical Symptoms:    The Alcohol Use Disorders Identification Test, Guidelines for Use in Primary Care, Second Edition.  World Science Writer [facility]). Score between 0-7:  no or low risk or alcohol related problems. Score between 8-15:  moderate risk of alcohol related problems. Score between 16-19:  high risk of alcohol related problems. Score 20 or above:  warrants further diagnostic evaluation for alcohol dependence and treatment.  CLINICAL FACTORS:   Depression:   Impulsivity Insomnia Alcohol/Substance Abuse/Dependencies Unstable or Poor Therapeutic Relationship Previous Psychiatric Diagnoses and Treatments   Musculoskeletal: Strength & Muscle Tone: within normal limits Gait & Station: normal Patient leans: N/A  Psychiatric Specialty Exam:  Presentation  General Appearance:  Casual; Fairly Groomed  Eye Contact: Fair  Speech: Clear and Coherent; Normal Rate  Speech Volume: Normal  Handedness:No data recorded  Mood and Affect  Mood: Depressed; Anxious  Affect: Non-Congruent   Thought Process  Thought Processes: Coherent; Goal  Directed  Descriptions of Associations:Intact  Orientation:Full (Time, Place and Person)  Thought Content:Logical  History of Schizophrenia/Schizoaffective disorder:No  Duration of Psychotic Symptoms:N/A  Hallucinations:Hallucinations: None  Ideas of Reference:None  Suicidal Thoughts:Suicidal Thoughts: No  Homicidal Thoughts:Homicidal Thoughts: No   Sensorium  Memory: Immediate Good; Recent Good; Remote Good  Judgment: Fair  Insight: Shallow   Executive Functions  Concentration: Good  Attention Span: Good  Recall: Good  Fund of Knowledge: Fair  Language: Good  Psychomotor Activity  Psychomotor Activity: Psychomotor Activity: Normal  Assets  Assets: Communication Skills; Desire for Improvement; Financial Resources/Insurance; Housing; Physical Health; Resilience  Sleep  Sleep: Sleep: Poor Number of Hours of Sleep: 5  Physical Exam: See H&P.  Blood pressure 109/81, pulse 80, resp. rate 18, height 4' 11 (1.499 m), weight 54.1 kg, SpO2 100%, unknown if currently breastfeeding. Body mass index is 24.08 kg/m.  COGNITIVE FEATURES THAT CONTRIBUTE TO RISK:  Closed-mindedness and Thought constriction (tunnel vision)    SUICIDE RISK:   Severe:  Frequent, intense, and enduring suicidal ideation, specific plan, no subjective intent, but some objective markers of intent (i.e., choice of lethal method), the method is accessible, some limited preparatory behavior, evidence of impaired self-control, severe dysphoria/symptomatology, multiple risk factors present, and few if any protective factors, particularly a lack of social support.  PLAN OF CARE: See H&P.  I certify that inpatient services furnished can reasonably be expected to improve the patient's condition.   Mac Bolster, NP, pmhnp, fnp-bc. 08/22/2024, 1:11 PM

## 2024-08-23 MED ORDER — WHITE PETROLATUM EX OINT
TOPICAL_OINTMENT | CUTANEOUS | Status: AC
  Administered 2024-08-23: 1
  Filled 2024-08-23: qty 5

## 2024-08-23 NOTE — Group Note (Signed)
 Date:  08/23/2024 Time:  10:00 AM  Group Topic/Focus:  Goals Group:   The focus of this group is to help patients establish daily goals to achieve during treatment and discuss how the patient can incorporate goal setting into their daily lives to aide in recovery. Orientation:   The focus of this group is to educate the patient on the purpose and policies of crisis stabilization and provide a format to answer questions about their admission.  The group details unit policies and expectations of patients while admitted.    Participation Level:  Did Not Attend   Melody Kim 08/23/2024, 10:00 AM

## 2024-08-23 NOTE — Group Note (Signed)
 Date:  08/23/2024 Time:  10:29 AM  Group Topic/Focus:  Social Wellness; Foster american financial, enhance communication skills, and promote a sense of belonging among participants. Through supportive group activities, discussions, and shared experiences, the group encourages healthy relationships, mutual respect, and social engagement to improve overall well-being and community connectedness.  Participation Level:  Did Not Attend   Melody Kim 08/23/2024, 10:29 AM

## 2024-08-23 NOTE — Plan of Care (Signed)
   Problem: Education: Goal: Knowledge of [facility] Education information/materials will improve Outcome: Progressing   Problem: Education: Goal: Verbalization of understanding the information provided will improve Outcome: Progressing

## 2024-08-23 NOTE — BHH Counselor (Signed)
 Adult Comprehensive Assessment  Patient ID: Melody Kim, female   DOB: [birth year], 28 y.o.   MRN: [id number]  Information Source: Information source: Patient  Current Stressors:  Patient states their primary concerns and needs for treatment are:: Just stressed out, overthinking and overreacting, over a relationship issue Patient states their goals for this hospitilization and ongoing recovery are:: I'm learning new ways to cope and keep myself busy Educational / Learning stressors: None reported Employment / Job issues: Pt reports she works [employer], pt reports she needs a work note at discharge Family Relationships: None reported Surveyor, Quantity / Lack of resources (include bankruptcy): None reported Housing / Lack of housing: None reported Physical health (include injuries & life threatening diseases): None reported Social relationships: We broke up, I just couldn't deal with it Substance abuse: None reported Bereavement / Loss: None reported  Living/Environment/Situation:  Living Arrangements: Spouse/significant other Who else lives in the home?: Pt reports that she lives with her ex-boyfriend now, reports they just broke up How long has patient lived in current situation?: since May 2025 What is atmosphere in current home: Comfortable, Other (Comment) (normal)  Family History:  Marital status: Single Are you sexually active?: Yes What is your sexual orientation?: heterosexual Has your sexual activity been affected by drugs, alcohol, medication, or emotional stress?: no Does patient have children?: Yes How many children?: 2 How is patient's relationship with their children?: not great, it bothers me Pt reports that her children are both autistic and that she had given them to their father. Pt reports last year she had a mental breakdown after she was raising them by herself and working. Pt reports her daughter had an ear infection and reports she was  screaming for hours and reports her neighbors called the police on her twice in a night. Pt reports she had a breakdown due to all the stress and gave her children to their father to try and get herself together. Pt reports her childrens father resents her for this and would not let her see the children but now he has begun letting her see the children because she reports he is also overwhelmed with raising them a lone.  Childhood History:  By whom was/is the patient raised?: Mother Additional childhood history information: Pt reports she was adopted by her older sister who she considers her mom. Pt reports her biological mom was on drugs Description of patient's relationship with caregiver when they were a child: Great, I had a great upbringing, family, we were all close, we had family reunions and get togethers Patient's description of current relationship with people who raised him/her: Not really as close, when I got older, not really close How were you disciplined when you got in trouble as a child/adolescent?: stern talking to, I may have gotten some whoopings, I was a good kid Does patient have siblings?: Yes Number of Siblings: 3 Description of patient's current relationship with siblings: Pt reports one of her sisters passed away in 09-05-2017, but they were close. Reports her oldest brother [place] and was in jail. Pt reports that her second older brother lives in [place]. Did patient suffer any verbal/emotional/physical/sexual abuse as a child?: No Did patient suffer from severe childhood neglect?: No Has patient ever been sexually abused/assaulted/raped as an adolescent or adult?: No Was the patient ever a victim of a crime or a disaster?: No Witnessed domestic violence?: No Has patient been affected by domestic violence as an adult?: No  Education:  Highest  grade of school patient has completed: some college- nursing Currently a student?: No Learning disability?:  No  Employment/Work Situation:   Employment Situation: Employed Where is Patient Currently Employed?: [employer] Long has Patient Been Employed?: October 20th, 2025 Are You Satisfied With Your Job?: Yes Do You Work More Than One Job?: No Work Stressors: None reported Patient's Job has Been Impacted by Current Illness: Yes Describe how Patient's Job has Been Impacted: Pt reports she just started her job in October and now she is currently in the hospital What is the Longest Time Patient has Held a Job?: 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?: No  Financial Resources:   Financial resources: Income from employment, Medicaid Does patient have a representative payee or guardian?: No  Alcohol/Substance Abuse:   What has been your use of drugs/alcohol within the last 12 months?: No I don't drink or smoke, but the last few days I went to the [facility] store, cause I thought it would help If attempted suicide, did drugs/alcohol play a role in this?: No Alcohol/Substance Abuse Treatment Hx: Denies past history If yes, describe treatment: n/a Has alcohol/substance abuse ever caused legal problems?: No  Social Support System:   Conservation Officer, Nature Support System: Fair Museum/gallery Exhibitions Officer System: my bestfriend Type of faith/religion: no How does patient's faith help to cope with current illness?: n/a  Leisure/Recreation:   Do You Have Hobbies?: Yes Leisure and Hobbies: I like to sleep and isolate myself, and be away from everybody  Strengths/Needs:   What is the patient's perception of their strengths?: Understanding people and feelings, like why they think this and how they think this. I'm good at cleaning, putting on some music Patient states they can use these personal strengths during their treatment to contribute to their recovery: Pt does not report Patient states these barriers may  affect/interfere with their treatment: None reported Patient states these barriers may affect their return to the community: None reported Other important information patient would like considered in planning for their treatment: None reported  Discharge Plan:   Currently receiving community mental health services: No Patient states concerns and preferences for aftercare planning are: I would like to start seeing one Patient states they will know when they are safe and ready for discharge when: I guess just feeling how I feel right now, I feel real clear-minded, clear-headed, I feel real normal, I was feeling heavy. I'm able to think reasonably Does patient have access to transportation?: Yes Does patient have financial barriers related to discharge medications?: No Patient description of barriers related to discharge medications: None reported Will patient be returning to same living situation after discharge?: Yes  Summary/Recommendations:   Summary and Recommendations (to be completed by the evaluator): Patient is a 28 year-old female [place] [place] [facility] According to H&P, pt presented from [facility]. H&P states, This is the first psychiatric admission in this [facility] for this 28 year old AA female with hx of previous psychiatric hospitalization at the [facility] behavioral hospital in 2022 for similar complaints (patient apparently put a gun to her head in a suicide attempt) at the time. Patient is being admitted to the [facility] from the [facility] with complaint of suicidal ideations with an undisclosed plan. Patient was apparently taken to the [facility] ED by the police. Upon assessment today, pt reports she attempted to overdose because she was overwhelmed from multiple  stressors. Pt reported she had recently broken up with her boyfriend as well as her current relationship with her children. Pt reports that she became overwhelmed after  the break-up and  reports clouded judgment. Pt reports she does not normally drink but went to the [facility] store to get alcohol because she thougbt it would make me feel better. Pt denies any issues with substance use. Pt reports she also just began a new work from home job at [employer] in October. Pt reports her support system is her bestfriend. Pt reports she would like both therapy and psychiatry follow-up. Pt's primary diagnosis is Major depressive disorder, recurrent episode, moderate (HCC).  Recommendations include: crisis stabilization, therapeutic milieu, encourage group attendance and participation, medication management for mood stabilization and development of comprehensive mental wellness/sobriety plan.  Melody Kim. 08/23/2024

## 2024-08-23 NOTE — Group Note (Signed)
 Date:  08/23/2024 Time:  7:38 PM  Group Topic/Focus:  Documentary on [place] and it's impact on mental health.    Participation Level:  Active  Participation Quality:  Appropriate  Affect:  Appropriate  Cognitive:  Appropriate  Insight: Appropriate  Engagement in Group:  Engaged  Modes of Intervention:  Discussion and Education  Additional Comments:    Juliene CHRISTELLA Huddle 08/23/2024, 7:38 PM

## 2024-08-23 NOTE — Plan of Care (Signed)
   Problem: Education: Goal: Emotional status will improve Outcome: Progressing Goal: Mental status will improve Outcome: Progressing

## 2024-08-23 NOTE — Progress Notes (Signed)
°   08/23/24 1200  Psych Admission Type (Psych Patients Only)  Admission Status Involuntary  Psychosocial Assessment  Patient Complaints None  Eye Contact Fair  Facial Expression Flat  Affect Appropriate to circumstance  Speech Logical/coherent  Interaction Assertive  Motor Activity Other (Comment) (steady gait)  Appearance/Hygiene Unremarkable  Behavior Characteristics Cooperative;Appropriate to situation  Mood Pleasant  Thought Process  Coherency WDL  Content WDL  Delusions None reported or observed  Perception WDL  Hallucination None reported or observed  Judgment Poor  Confusion None  Danger to Self  Current suicidal ideation? Denies  Self-Injurious Behavior No self-injurious ideation or behavior indicators observed or expressed   Danger to Others  Danger to Others None reported or observed

## 2024-08-23 NOTE — Group Note (Signed)
 Date:  08/23/2024 Time:  11:33 AM  Group Topic/Focus:  Physical Wellness: Support participants in improving overall well-being through guided meditation practices that promote relaxation, body awareness, and stress reduction. This group provides a structured and supportive environment where individuals can learn and practice mindfulness techniques to enhance physical health, regulate the nervous system, and develop greater awareness of the mind-body connection. Through guided meditation, participants are encouraged to reduce physical tension, improve breathing, increase focus, and cultivate habits that support long-term physical and emotional wellness.     Participation Level:  Active  Participation Quality:  Appropriate and Attentive  Affect:  Appropriate  Cognitive:  Appropriate  Insight: Appropriate  Engagement in Group:  Engaged  Modes of Intervention:  Activity  Additional Comments:  N/A  Lauris JONELLE Morales 08/23/2024, 11:33 AM

## 2024-08-23 NOTE — Progress Notes (Signed)
(  Sleep Hours) - 6.75 hours (Any PRNs that were needed, meds refused, or side effects to meds)-  None (Any disturbances and when (visitation, over night)- None (Concerns raised by the patient)-  None (SI/HI/AVH)-  Denies

## 2024-08-23 NOTE — Progress Notes (Signed)
 [facility] MD Progress Note  08/23/2024 2:14 PM Melody Kim  MRN:  [id number]  Reason for admission: 28 year old AA female with hx of previous psychiatric hospitalization at the [facility] in 2022 for similar complaints (patient apparently put a gun to her head in a suicide attempt) at the time. Patient is being admitted to the [facility] from the [facility] with complaint of suicidal ideations with an undisclosed plan. Patient was apparently taken to the [facility] ED by the police. After evaluation/medical clearance, patient was referred & transferred to the [facility] for psychiatric evaluation & treatments. Patient's current lab results reviewed. Her BAL was 63 upon her arrival to the ED.   Daily notes: [name] is seen. Chart reviewed. The chart findings discussed with the treatment team during the team meeting this afternoon. She presents alert, oriented & aware of situation. He is visible on the unit, attending group sessions. She presents with an improving affect, good eye contact & verbally responsive. She reports, I'm starting to feel better. My mind is starting to feel clear. I'm starting to think clearer & rationally. I'm not feeling heavy-hearted like I have been feeling in last couple of weeks. I slept well last night. The best sleep I have had in a long time. I am attending group sessions & learning coping skills. I'm hoping that this is the beginning of me changing my life around for the better so I can get my children to live with me again. I'm eating food again because in the few weeks, I have not had an appetite. I took the medicines, no side effects to report. Patient currently denies any SIHI, AVH, delusional thoughts or paranoia. She does not appear to be responding to any internal stimuli. There are no changes made on the current plan of care. Patient is instructed & encouraged to continue to take her medication & attend/participate in the group  sessions.Reviewed vital signs, stable. Continue current plan of care as already in progress.  Principal Problem: Major depressive disorder, recurrent episode, moderate (HCC)  Diagnosis: Principal Problem:   Major depressive disorder, recurrent episode, moderate (HCC) Active Problems:   Suicidal ideations   Generalized anxiety disorder  Total Time spent with patient: 45 minutes  Past Psychiatric History: See H&P.  Past Medical History:  Past Medical History:  Diagnosis Date   Medical history non-contributory     Past Surgical History:  Procedure Laterality Date   WISDOM TOOTH EXTRACTION     Family History:  Family History  Problem Relation Age of Onset   Diabetes Mother    Diabetes Father    Family Psychiatric  History: See H&P.  Social History:  Social History   Substance and Sexual Activity  Alcohol Use No     Social History   Substance and Sexual Activity  Drug Use No    Social History   Socioeconomic History   Marital status: Single    Spouse name: Not on file   Number of children: Not on file   Years of education: Not on file   Highest education level: Not on file  Occupational History   Not on file  Tobacco Use   Smoking status: Never   Smokeless tobacco: Never  Substance and Sexual Activity   Alcohol use: No   Drug use: No   Sexual activity: Yes  Other Topics Concern   Not on file  Social History Narrative   Not on file   Social Drivers of Health  Tobacco Use: Low Risk (08/21/2024)   Patient History    Smoking Tobacco Use: Never    Smokeless Tobacco Use: Never    Passive Exposure: Not on file  Financial Resource Strain: Medium Risk (08/30/2021)   Received from [facility]   Overall Financial Resource Strain (CARDIA)    Difficulty of Paying Living Expenses: Somewhat hard  Food Insecurity: No Food Insecurity (08/22/2024)   Epic    Worried About Radiation Protection Practitioner of Food in the Last Year: Never true    Ran Out of Food in the Last Year:  Never true  Transportation Needs: No Transportation Needs (08/22/2024)   Epic    Lack of Transportation (Medical): No    Lack of Transportation (Non-Medical): No  Physical Activity: Not on file  Stress: Stress Concern Present (08/30/2021)   Received from [facility] Occup[facility] - Occupational Stress Questionnaire    Feeling of Stress : Rather much  Social Connections: Not on file  Depression (PHQ2-9): Not on file  Alcohol Screen: Low Risk (08/22/2024)   Alcohol Screen    Last Alcohol Screening Score (AUDIT): 1  Housing: Low Risk (08/22/2024)   Epic    Unable to Pay for Housing in the Last Year: No    Number of Times Moved in the Last Year: 0    Homeless in the Last Year: No  Utilities: Not At Risk (08/22/2024)   Epic    Threatened with loss of utilities: No  Health Literacy: Not on file   Additional Social History:   Sleep: Good Estimated Sleeping Duration (Last 24 Hours): 6.25-7.00 hours  Appetite:  Good  Current Medications: Current Facility-Administered Medications  Medication Dose Route Frequency Provider Last Rate Last Admin   acetaminophen  (TYLENOL ) tablet 650 mg  650 mg Oral Q6H PRN Trudy Carwin, NP       alum & mag hydroxide-simeth (MAALOX/MYLANTA) 200-200-20 MG/5ML suspension 30 mL  30 mL Oral Q4H PRN Trudy Carwin, NP       busPIRone  (BUSPAR ) tablet 5 mg  5 mg Oral BID Mintie Witherington I, NP   5 mg at 08/23/24 0745   haloperidol  (HALDOL ) tablet 5 mg  5 mg Oral TID PRN Trudy Carwin, NP       And   diphenhydrAMINE  (BENADRYL ) capsule 50 mg  50 mg Oral TID PRN Trudy Carwin, NP       haloperidol  lactate (HALDOL ) injection 5 mg  5 mg Intramuscular TID PRN Trudy Carwin, NP       And   diphenhydrAMINE  (BENADRYL ) injection 50 mg  50 mg Intramuscular TID PRN Trudy Carwin, NP       And   LORazepam  (ATIVAN ) injection 2 mg  2 mg Intramuscular TID PRN Trudy Carwin, NP       haloperidol  lactate (HALDOL ) injection 10 mg  10 mg Intramuscular  TID PRN Trudy Carwin, NP       And   diphenhydrAMINE  (BENADRYL ) injection 50 mg  50 mg Intramuscular TID PRN Trudy Carwin, NP       And   LORazepam  (ATIVAN ) injection 2 mg  2 mg Intramuscular TID PRN Trudy Carwin, NP       FLUoxetine  (PROZAC ) capsule 10 mg  10 mg Oral Daily Sania Noy, Mac I, NP   10 mg at 08/23/24 0745   hydrOXYzine  (ATARAX ) tablet 25 mg  25 mg Oral TID PRN Collene Mac I, NP       hydrOXYzine  (ATARAX ) tablet 50 mg  50 mg Oral QHS  Collene Gouge I, NP   50 mg at 08/22/24 2112   magnesium  hydroxide (MILK OF MAGNESIA) suspension 30 mL  30 mL Oral Daily PRN Trudy Carwin, NP        Lab Results: No results found for this or any previous visit (from the past 48 hours).  Blood Alcohol level:  Lab Results  Component Value Date   ETH 63 (H) 08/21/2024   ETH <11 08/08/2014   Metabolic Disorder Labs: No results found for: HGBA1C, MPG No results found for: PROLACTIN No results found for: CHOL, TRIG, HDL, CHOLHDL, VLDL, LDLCALC  Physical Findings: AIMS:  ,  ,  ,  ,  ,  ,   CIWA:    COWS:     Musculoskeletal: Strength & Muscle Tone: within normal limits Gait & Station: normal Patient leans: N/A  Psychiatric Specialty Exam:  Presentation  General Appearance:  Casual; Fairly Groomed  Eye Contact: Fair  Speech: Clear and Coherent; Normal Rate  Speech Volume: Normal  Handedness:No data recorded  Mood and Affect  Mood: Depressed; Anxious  Affect: Non-Congruent  Thought Process  Thought Processes: Coherent; Goal Directed  Descriptions of Associations:Intact  Orientation:Full (Time, Place and Person)  Thought Content:Logical  History of Schizophrenia/Schizoaffective disorder:No  Duration of Psychotic Symptoms:N/A  Hallucinations:Hallucinations: None  Ideas of Reference:None  Suicidal Thoughts:Suicidal Thoughts: No  Homicidal Thoughts:Homicidal Thoughts: No  Sensorium  Memory: Immediate Good; Recent Good; Remote  Good  Judgment: Fair  Insight: Shallow  Executive Functions  Concentration: Good  Attention Span: Good  Recall: Good  Fund of Knowledge: Fair  Language: Good  Psychomotor Activity  Psychomotor Activity: Psychomotor Activity: Normal  Assets  Assets: Communication Skills; Desire for Improvement; Financial Resources/Insurance; Housing; Physical Health; Resilience  Sleep  Sleep: Sleep: Poor Number of Hours of Sleep: 5  Physical Exam: Physical Exam Vitals and nursing note reviewed.  HENT:     Head: Normocephalic.     Nose: Nose normal.  Cardiovascular:     Rate and Rhythm: Normal rate.     Pulses: Normal pulses.  Pulmonary:     Effort: Pulmonary effort is normal.  Genitourinary:    Comments: Deferred. Musculoskeletal:        General: Normal range of motion.     Cervical back: Normal range of motion.  Skin:    General: Skin is dry.  Neurological:     General: No focal deficit present.     Mental Status: She is alert and oriented to person, place, and time.    Review of Systems  Constitutional:  Negative for chills, diaphoresis and fever.  HENT:  Negative for congestion and sore throat.   Respiratory:  Negative for cough, shortness of breath and wheezing.   Cardiovascular:  Negative for chest pain and palpitations.  Gastrointestinal:  Negative for abdominal pain, blood in stool, constipation, diarrhea, heartburn, melena, nausea and vomiting.  Genitourinary:  Negative for dysuria.  Musculoskeletal:  Negative for joint pain and myalgias.  Skin:  Negative for itching and rash.  Neurological:  Negative for dizziness, tingling, tremors, sensory change, speech change, focal weakness, seizures, loss of consciousness, weakness and headaches.  Endo/Heme/Allergies:        Allergies: Egg shells (chicken).  Psychiatric/Behavioral:  Positive for depression ((Improving).) and substance abuse (BAL 63.). Negative for hallucinations, memory loss and suicidal ideas. The  patient is not nervous/anxious and does not have insomnia.    Blood pressure 124/74, pulse 78, temperature (!) 97.4 F (36.3 C), resp. rate 16, height 4' 11 (  1.499 m), weight 54.1 kg, SpO2 99%, unknown if currently breastfeeding. Body mass index is 24.08 kg/m.  Treatment Plan Summary: Daily contact with patient to assess and evaluate symptoms and progress in treatment and Medication management.   Principal/active diagnoses. Major depressive disorder, recurrent episode, moderate (HCC).  Generalized anxiety disorder.   -Continue Fluoxetine  10 mg po daily for depression.  -Continue Buspar  5 mg po bid for anxiety.  -Continue Hydroxyzine  25 mg po tid prn for anxiety.  -Continue Hydroxyzine  50 mg po Q hs for insomnia.  Plan: The risks/benefits/side-effects/alternatives to the medications in use were discussed in detail with the patient and time was given for patient's questions. The patient consents to medication trial.    Other PRNS -Continue Tylenol  650 mg every 6 hours PRN for mild pain -Continue Maalox 30 ml Q 4 hrs PRN for indigestion -Continue MOM 30 ml po Q 6 hrs for constipation   Safety and Monitoring: Voluntary admission to inpatient psychiatric unit for safety, stabilization and treatment Daily contact with patient to assess and evaluate symptoms and progress in treatment Patient's case to be discussed in multi-disciplinary team meeting Observation Level : q15 minute checks Vital signs: q12 hours Precautions: Safety   Discharge Planning: Social work and case management to assist with discharge planning and identification of hospital follow-up needs prior to discharge Estimated LOS: 5-7 days Discharge Concerns: Need to establish a safety plan; Medication compliance and effectiveness Discharge Goals: Return home with outpatient referrals for mental health follow-up including medication management/psychotherapy  Mac Bolster, NP, pmhnp, fnp-bc. 08/23/2024, 2:14 PM

## 2024-08-23 NOTE — [facility] Group Notes (Signed)
 [facility] Group Notes:  (Nursing/MHT/Case Management/Adjunct)  Date:  08/23/2024  Time:  9:17 PM  Type of Therapy:  Wrap up group  Participation Level:  Active  Participation Quality:  Appropriate  Affect:  Appropriate  Cognitive:  Appropriate  Insight:  Appropriate  Engagement in Group:  Engaged  Modes of Intervention:  Education  Summary of Progress/Problems: Goal to find coping skills to manage stress. Rated day 10/10.  Melody Kim 08/23/2024, 9:17 PM

## 2024-08-23 NOTE — Group Note (Signed)
 Date:  08/23/2024 Time:  11:05 AM  Group Topic/Focus: Social Work Group    patient did attend social work group  [facility] 08/23/2024, 11:05 AM

## 2024-08-23 NOTE — Progress Notes (Signed)
°   08/23/24 2217  Psych Admission Type (Psych Patients Only)  Admission Status Involuntary  Psychosocial Assessment  Patient Complaints None  Eye Contact Fair  Facial Expression Animated  Affect Appropriate to circumstance  Speech Logical/coherent  Interaction Assertive  Motor Activity Other (Comment) (WDL)  Appearance/Hygiene Unremarkable  Behavior Characteristics Appropriate to situation  Mood Pleasant  Thought Process  Coherency WDL  Content WDL  Delusions None reported or observed  Perception WDL  Hallucination None reported or observed  Judgment Poor  Confusion None  Danger to Self  Current suicidal ideation? Denies  Self-Injurious Behavior No self-injurious ideation or behavior indicators observed or expressed   Agreement Not to Harm Self Yes  Description of Agreement verbal  Danger to Others  Danger to Others None reported or observed

## 2024-08-23 NOTE — Group Note (Signed)
 LCSW Group Therapy Note   Group Date: 08/23/2024 Start Time: 1000 End Time: 1045  Type of Therapy and Topic:  Group Therapy: Asking for Help  Participation Level:  Active  Description of Group: This group discussed Asking for Help in their personal lives. Patients will explore why it can be difficult to ask for help re: importance, the difference between healthy and unhealthy help, and negative and postive outcomes/responses related to asking for help. Patients will be encouraged to identify current people in their own lives who they contact for help. Patients will be encouraged to explore safe and healthy ways to make their needs known to others in their lives.  Therapeutic Goals:  1.  Patient will identify areas in their life where making their needs known could be used to improve their life.  2.  Patient will identify signs/triggers that they need to seek help.  3.  Patient will demonstrate ability to communicate their needs and identify who to contact through discussion and/or role plays  Summary of Patient Progress:    Patient present the entire group. Patient talked about asking for help early rather than keeping it bottled in.  Therapeutic Modalities:   Cognitive Behavioral Therapy Solution-Focused Therapy  Camelia Olden, LCSW 08/23/2024  11:37 AM

## 2024-08-24 ENCOUNTER — Encounter (HOSPITAL_COMMUNITY): Payer: Self-pay | Admitting: Psychiatry

## 2024-08-24 NOTE — Progress Notes (Signed)
 D. Pt has been pleasant, bright upon approach, reported sleeping well last night, described her appetite and concentration as 'good', and energy level as 'normal'. Per pt's self inventory, pt rated her depression,hopelessness and anxiety all 0's. Pt's stated goal today is to stay active, stay positive and talk more. Pt has been visible in the milieu, observed interacting well with peers and attending group. Pt currently denies SI/HI and AVH and doesn't appear to be responding to internal stimuli. A. Labs and vitals monitored. Pt given and educated on medications. Pt supported emotionally and encouraged to express concerns and ask questions.   R. Pt remains safe with 15 minute checks. Will continue POC.

## 2024-08-24 NOTE — Progress Notes (Signed)
°   08/24/24 2032  Psych Admission Type (Psych Patients Only)  Admission Status Involuntary  Psychosocial Assessment  Patient Complaints None  Eye Contact Fair  Facial Expression Animated  Affect Appropriate to circumstance  Speech Logical/coherent  Interaction Assertive  Motor Activity Other (Comment) (WDL)  Appearance/Hygiene Unremarkable  Behavior Characteristics Appropriate to situation  Mood Pleasant  Thought Process  Coherency WDL  Content WDL  Delusions None reported or observed  Perception WDL  Hallucination None reported or observed  Judgment Poor  Confusion None  Danger to Self  Current suicidal ideation? Denies  Self-Injurious Behavior No self-injurious ideation or behavior indicators observed or expressed   Agreement Not to Harm Self Yes  Description of Agreement verbal  Danger to Others  Danger to Others None reported or observed

## 2024-08-24 NOTE — Progress Notes (Signed)
(  Sleep Hours) -  8 hours (Any PRNs that were needed, meds refused, or side effects to meds)- None (Any disturbances and when (visitation, overnight)- None (Concerns raised by the patient)- None (SI/HI/AVH)- Denies

## 2024-08-24 NOTE — [facility] Suicide Risk Assessment (Signed)
 [facility] INPATIENT:  Family/Significant Other Suicide Prevention Education  Suicide Prevention Education:  Education Completed; Dorn Stark (roommate/ex-partner) [phone],  (name of family member/significant other) has been identified by the patient as the family member/significant other with whom the patient will be residing, and identified as the person(s) who will aid the patient in the event of a mental health crisis (suicidal ideations/suicide attempt).  With written consent from the patient, the family member/significant other has been provided the following suicide prevention education, prior to the and/or following the discharge of the patient.  The suicide prevention education provided includes the following: Suicide risk factors Suicide prevention and interventions National Suicide Hotline telephone number [facility] assessment telephone number [facility] Emergency Assistance 911 [facility] and/or Residential Mobile Crisis Unit telephone number  Request made of family/significant other to: Remove weapons (e.g., guns, rifles, knives), all items previously/currently identified as safety concern.   Remove drugs/medications (over-the-counter, prescriptions, illicit drugs), all items previously/currently identified as a safety concern.  Dorn states the patient is welcome to return home to the address on file upon discharge. Dorn denies any presence of weapons in the home and agrees to the requests made above. Dorn knows who to contact in the event of a mental health crisis.   The family member/significant other verbalizes understanding of the suicide prevention education information provided.  The family member/significant other agrees to remove the items of safety concern listed above.  Louetta Lame 08/24/2024, 3:15 PM

## 2024-08-24 NOTE — Plan of Care (Signed)
   Problem: Education: Goal: Emotional status will improve Outcome: Progressing Goal: Mental status will improve Outcome: Progressing   Problem: Activity: Goal: Interest or engagement in activities will improve Outcome: Progressing

## 2024-08-24 NOTE — Progress Notes (Signed)
 Adult Psychoeducational Group Note  Date:  08/24/2024 Time:  8:45 PM  Group Topic/Focus:  Wrap-Up Group:   The focus of this group is to help patients review their daily goal of treatment and discuss progress on daily workbooks.  Participation Level:  Active  Participation Quality:  Appropriate  Affect:  Appropriate  Cognitive:  Appropriate  Insight: Appropriate  Engagement in Group:  Engaged  Modes of Intervention:  Discussion  Additional Comments:  Pt stated her goal for the day was to laugh more. Pt met goal.  Melody Kim 08/24/2024, 8:45 PM

## 2024-08-24 NOTE — Progress Notes (Signed)
 [facility] MD Progress Note  08/24/2024 1:11 PM Melody Kim  MRN:  [id number]  Reason for admission: 28 year old AA female with hx of previous psychiatric hospitalization at the [facility] in 2022 for similar complaints (patient apparently put a gun to her head in a suicide attempt) at the time. Patient is being admitted to the [facility] from the [facility] with complaint of suicidal ideations with an undisclosed plan. Patient was apparently taken to the [facility] ED by the police. After evaluation/medical clearance, patient was referred & transferred to the [facility] for psychiatric evaluation & treatments. Patient's current lab results reviewed. Her BAL was 63 upon her arrival to the ED.   Daily notes: Melody Kim is seen. Chart reviewed. The chart findings discussed with the treatment team during the team meeting this afternoon. She presents alert, oriented & aware of situation. She is visible on the unit, attending group sessions. She presents with an improving affect, good eye contact & verbally responsive. She reports, My mood is getting better & better by the day. My sleep has improved a lot so is my appetite. I'm taking my medicines as recommended without any side effects. I'm learning now how to love & care for myself because all I have done over the years is listen to & care for others. I will try to look after myself now. Melody Kim currently denies any SIHI, AVH, delusional thoughts or paranoia. She does not appear to be responding to any internal stimuli. If patient maintains this stability all through today without any changes, she can be discharged tomorrow morning.  Principal Problem: Major depressive disorder, recurrent episode, moderate (HCC)  Diagnosis: Principal Problem:   Major depressive disorder, recurrent episode, moderate (HCC) Active Problems:   Suicidal ideations   Generalized anxiety disorder  Total Time spent with patient: 45 minutes  Past  Psychiatric History: See H&P.  Past Medical History:  Past Medical History:  Diagnosis Date   Medical history non-contributory     Past Surgical History:  Procedure Laterality Date   WISDOM TOOTH EXTRACTION     Family History:  Family History  Problem Relation Age of Onset   Diabetes Mother    Diabetes Father    Family Psychiatric  History: See H&P.  Social History:  Social History   Substance and Sexual Activity  Alcohol Use No     Social History   Substance and Sexual Activity  Drug Use No    Social History   Socioeconomic History   Marital status: Single    Spouse name: Not on file   Number of children: Not on file   Years of education: Not on file   Highest education level: Not on file  Occupational History   Not on file  Tobacco Use   Smoking status: Never   Smokeless tobacco: Never  Substance and Sexual Activity   Alcohol use: No   Drug use: No   Sexual activity: Yes  Other Topics Concern   Not on file  Social History Narrative   Not on file   Social Drivers of Health   Tobacco Use: Low Risk (08/24/2024)   Patient History    Smoking Tobacco Use: Never    Smokeless Tobacco Use: Never    Passive Exposure: Not on file  Financial Resource Strain: Medium Risk (08/30/2021)   Received from [facility]   Overall Financial Resource Strain (CARDIA)    Difficulty of Paying Living Expenses: Somewhat hard  Food Insecurity: No Food  Insecurity (08/22/2024)   Epic    Worried About Programme Researcher, Broadcasting/film/video in the Last Year: Never true    Ran Out of Food in the Last Year: Never true  Transportation Needs: No Transportation Needs (08/22/2024)   Epic    Lack of Transportation (Medical): No    Lack of Transportation (Non-Medical): No  Physical Activity: Not on file  Stress: Stress Concern Present (08/30/2021)   Received from [facility] - Occupational Stress Questionnaire    Feeling of Stress : Rather much  Social  Connections: Not on file  Depression (PHQ2-9): Not on file  Alcohol Screen: Low Risk (08/22/2024)   Alcohol Screen    Last Alcohol Screening Score (AUDIT): 1  Housing: Low Risk (08/22/2024)   Epic    Unable to Pay for Housing in the Last Year: No    Number of Times Moved in the Last Year: 0    Homeless in the Last Year: No  Utilities: Not At Risk (08/22/2024)   Epic    Threatened with loss of utilities: No  Health Literacy: Not on file   Additional Social History:   Sleep: Good Estimated Sleeping Duration (Last 24 Hours): 7.00-8.25 hours  Appetite:  Good  Current Medications: Current Facility-Administered Medications  Medication Dose Route Frequency Provider Last Rate Last Admin   acetaminophen  (TYLENOL ) tablet 650 mg  650 mg Oral Q6H PRN Trudy Carwin, NP       alum & mag hydroxide-simeth (MAALOX/MYLANTA) 200-200-20 MG/5ML suspension 30 mL  30 mL Oral Q4H PRN Trudy Carwin, NP       busPIRone  (BUSPAR ) tablet 5 mg  5 mg Oral BID Danzel Marszalek I, NP   5 mg at 08/24/24 9256   haloperidol  (HALDOL ) tablet 5 mg  5 mg Oral TID PRN Trudy Carwin, NP       And   diphenhydrAMINE  (BENADRYL ) capsule 50 mg  50 mg Oral TID PRN Trudy Carwin, NP       haloperidol  lactate (HALDOL ) injection 5 mg  5 mg Intramuscular TID PRN Trudy Carwin, NP       And   diphenhydrAMINE  (BENADRYL ) injection 50 mg  50 mg Intramuscular TID PRN Trudy Carwin, NP       And   LORazepam  (ATIVAN ) injection 2 mg  2 mg Intramuscular TID PRN Trudy Carwin, NP       haloperidol  lactate (HALDOL ) injection 10 mg  10 mg Intramuscular TID PRN Trudy Carwin, NP       And   diphenhydrAMINE  (BENADRYL ) injection 50 mg  50 mg Intramuscular TID PRN Trudy Carwin, NP       And   LORazepam  (ATIVAN ) injection 2 mg  2 mg Intramuscular TID PRN Trudy Carwin, NP       FLUoxetine  (PROZAC ) capsule 10 mg  10 mg Oral Daily Melody Kim I, NP   10 mg at 08/24/24 9256   hydrOXYzine  (ATARAX ) tablet 25 mg  25 mg Oral TID PRN Collene Gouge I,  NP       hydrOXYzine  (ATARAX ) tablet 50 mg  50 mg Oral QHS Lucile Didonato, Gouge I, NP   50 mg at 08/23/24 2101   magnesium  hydroxide (MILK OF MAGNESIA) suspension 30 mL  30 mL Oral Daily PRN Trudy Carwin, NP        Lab Results: No results found for this or any previous visit (from the past 48 hours).  Blood Alcohol level:  Lab Results  Component Value Date  ETH 63 (H) 08/21/2024   ETH <11 08/08/2014   Metabolic Disorder Labs: No results found for: HGBA1C, MPG No results found for: PROLACTIN No results found for: CHOL, TRIG, HDL, CHOLHDL, VLDL, LDLCALC  Physical Findings: AIMS:  ,  ,  ,  ,  ,  ,   CIWA:    COWS:     Musculoskeletal: Strength & Muscle Tone: within normal limits Gait & Station: normal Patient leans: N/A  Psychiatric Specialty Exam:  Presentation  General Appearance:  Casual; Fairly Groomed  Eye Contact: Good  Speech: Clear and Coherent; Normal Rate  Speech Volume: Normal  Handedness:Right   Mood and Affect  Mood: -- (Improving.)  Affect: Congruent  Thought Process  Thought Processes: Coherent; Goal Directed; Linear  Descriptions of Associations:Intact  Orientation:Full (Time, Place and Person)  Thought Content:Logical  History of Schizophrenia/Schizoaffective disorder:No  Duration of Psychotic Symptoms:N/A  Hallucinations:Hallucinations: None   Ideas of Reference:None  Suicidal Thoughts:Suicidal Thoughts: No  Homicidal Thoughts:Homicidal Thoughts: No  Sensorium  Memory: Immediate Good; Recent Good; Remote Good  Judgment: -- (Improving.)  Insight: Fair  Art Therapist  Concentration: Good  Attention Span: Good  Recall: Good  Fund of Knowledge: Fair  Language: Good  Psychomotor Activity  Psychomotor Activity: Psychomotor Activity: Normal  Assets  Assets: Communication Skills; Desire for Improvement; Financial Resources/Insurance; Housing; Physical Health; Resilience; Social  Support  Sleep  Sleep: Sleep: Good Number of Hours of Sleep: 8  Physical Exam: Physical Exam Vitals and nursing note reviewed.  HENT:     Head: Normocephalic.     Nose: Nose normal.  Cardiovascular:     Rate and Rhythm: Normal rate.     Pulses: Normal pulses.  Pulmonary:     Effort: Pulmonary effort is normal.  Genitourinary:    Comments: Deferred. Musculoskeletal:        General: Normal range of motion.     Cervical back: Normal range of motion.  Skin:    General: Skin is dry.  Neurological:     General: No focal deficit present.     Mental Status: She is alert and oriented to person, place, and time.    Review of Systems  Constitutional:  Negative for chills, diaphoresis and fever.  HENT:  Negative for congestion and sore throat.   Respiratory:  Negative for cough, shortness of breath and wheezing.   Cardiovascular:  Negative for chest pain and palpitations.  Gastrointestinal:  Negative for abdominal pain, blood in stool, constipation, diarrhea, heartburn, melena, nausea and vomiting.  Genitourinary:  Negative for dysuria.  Musculoskeletal:  Negative for joint pain and myalgias.  Skin:  Negative for itching and rash.  Neurological:  Negative for dizziness, tingling, tremors, sensory change, speech change, focal weakness, seizures, loss of consciousness, weakness and headaches.  Endo/Heme/Allergies:        Allergies: Egg shells (chicken).  Psychiatric/Behavioral:  Positive for depression ((Improving).) and substance abuse (BAL 63.). Negative for hallucinations, memory loss and suicidal ideas. The patient is not nervous/anxious and does not have insomnia.    Blood pressure 119/74, pulse 90, temperature 98.3 F (36.8 C), resp. rate 16, height 4' 11 (1.499 m), weight 54.1 kg, SpO2 99%, unknown if currently breastfeeding. Body mass index is 24.08 kg/m.  Treatment Plan Summary: Daily contact with patient to assess and evaluate symptoms and progress in treatment and  Medication management.   Principal/active diagnoses. Major depressive disorder, recurrent episode, moderate (HCC).  Generalized anxiety disorder.   -Continue Fluoxetine  10 mg po daily for depression.  -  Continue Buspar  5 mg po bid for anxiety.  -Continue Hydroxyzine  25 mg po tid prn for anxiety.  -Continue Hydroxyzine  50 mg po Q hs for insomnia.  Plan: The risks/benefits/side-effects/alternatives to the medications in use were discussed in detail with the patient and time was given for patient's questions. The patient consents to medication trial.    Other PRNS -Continue Tylenol  650 mg every 6 hours PRN for mild pain -Continue Maalox 30 ml Q 4 hrs PRN for indigestion -Continue MOM 30 ml po Q 6 hrs for constipation   Safety and Monitoring: Voluntary admission to inpatient psychiatric unit for safety, stabilization and treatment Daily contact with patient to assess and evaluate symptoms and progress in treatment Patient's case to be discussed in multi-disciplinary team meeting Observation Level : q15 minute checks Vital signs: q12 hours Precautions: Safety   Discharge Planning: Social work and case management to assist with discharge planning and identification of hospital follow-up needs prior to discharge Estimated LOS: 5-7 days Discharge Concerns: Need to establish a safety plan; Medication compliance and effectiveness Discharge Goals: Return home with outpatient referrals for mental health follow-up including medication management/psychotherapy  Mac Bolster, NP, pmhnp, fnp-bc. 08/24/2024, 1:11 PM Patient ID: Cherylle GORMAN Robins, female   DOB: 03-Mar-1996, 28 y.o.   MRN: [id number]

## 2024-08-24 NOTE — Group Note (Signed)
 Date:  08/24/2024 Time:  7:37 PM  Group Topic/Focus:  Self Care:   The focus of this group is to help patients understand the importance of self-care in order to improve or restore emotional, physical, spiritual, interpersonal, and financial health. Time management/priority setting.   Participation Level:  Active  Participation Quality:  Appropriate  Affect:  Appropriate  Cognitive:  Appropriate  Insight: Appropriate  Engagement in Group:  Engaged  Modes of Intervention:  Discussion and Education  Additional Comments:   Melody Kim 08/24/2024, 7:37 PM

## 2024-08-25 ENCOUNTER — Other Ambulatory Visit (HOSPITAL_COMMUNITY): Payer: Self-pay

## 2024-08-25 ENCOUNTER — Other Ambulatory Visit: Payer: Self-pay

## 2024-08-25 ENCOUNTER — Encounter (HOSPITAL_COMMUNITY): Payer: Self-pay

## 2024-08-25 MED ORDER — HYDROXYZINE HCL 50 MG PO TABS
50.0000 mg | ORAL_TABLET | Freq: Every day | ORAL | 0 refills | Status: AC
  Filled 2024-08-25 – 2024-09-01 (×2): qty 30, 30d supply, fill #0

## 2024-08-25 MED ORDER — FLUOXETINE HCL 10 MG PO CAPS
10.0000 mg | ORAL_CAPSULE | Freq: Every day | ORAL | 0 refills | Status: AC
  Filled 2024-08-25 – 2024-09-01 (×2): qty 30, 30d supply, fill #0

## 2024-08-25 MED ORDER — BUSPIRONE HCL 5 MG PO TABS
5.0000 mg | ORAL_TABLET | Freq: Two times a day (BID) | ORAL | 0 refills | Status: AC
  Filled 2024-08-25 – 2024-09-01 (×2): qty 60, 30d supply, fill #0

## 2024-08-25 NOTE — [facility] Group Notes (Signed)
 [facility] Group Notes:  (Nursing/MHT/Case Management/Adjunct)  Date:  08/25/2024  Time:  8:12 PM  Type of Therapy:  AA Group  Participation Level:  Active  Participation Quality:  Appropriate  Affect:  Appropriate  Cognitive:  Appropriate  Insight:  Appropriate  Engagement in Group:  Engaged  Modes of Intervention:  Education  Summary of Progress/Problems:Attended AA meeting.  Grayce LITTIE Essex 08/25/2024, 8:12 PM

## 2024-08-25 NOTE — Group Note (Signed)
 Recreation Therapy Group Note   Group Topic:Team Building  Group Date: 08/25/2024 Start Time: 0932 End Time: 1007 Facilitators: Draylon Mercadel-McCall, LRT,CTRS Location: [address] Dayroom   Group Topic: Communication, Team Building, Problem Solving  Goal Area(s) Addresses:  Patient will effectively work with peer towards shared goal.  Patient will identify skills used to make activity successful.  Patient will identify how skills used during activity can be used to reach post d/c goals.   Behavioral Response: Engaged  Intervention: STEM Activity  Activity: Stage Manager. In teams of 3-5, patients were given 12 plastic drinking straws and an equal length of masking tape. Using the materials provided, patients were asked to build a landing pad to catch a golf ball dropped from approximately 5 feet in the air. All materials were required to be used by the team in their design. LRT facilitated post-activity discussion.  Education: Pharmacist, Community, Scientist, Physiological, Discharge Planning   Education Outcome: Acknowledges education/In group clarification offered/Needs additional education.    Affect/Mood: Appropriate   Participation Level: Engaged   Participation Quality: Independent   Behavior: Appropriate   Speech/Thought Process: Focused   Insight: Good   Judgement: Good   Modes of Intervention: STEM Activity   Patient Response to Interventions:  Engaged   Education Outcome:  In group clarification offered    Clinical Observations/Individualized Feedback: Pt was engaged and worked with peer to come up with a concept to create their landing pad. Pt was social with peers and was able to successfully create a landing pad with partner.      Plan: Continue to engage patient in RT group sessions 2-3x/week.   Melody Kim, LRT,CTRS 08/25/2024 11:29 AM

## 2024-08-25 NOTE — Progress Notes (Signed)
 [facility] MD Progress Note  08/25/2024 7:55 PM Melody Kim  MRN:  [id number]  Principal Problem: Major depressive disorder, recurrent episode, moderate (HCC)  Diagnosis: Principal Problem:   Major depressive disorder, recurrent episode, moderate (HCC) Active Problems:   Suicidal ideations   Generalized anxiety disorder   Reason for Admission: 28 year old AA female with hx of previous psychiatric hospitalization at the [facility] in 2022 for similar complaints (patient apparently put a gun to her head in a suicide attempt) at the time. Patient is being admitted to the [facility] from the [facility] with complaint of suicidal ideations with an undisclosed plan. Patient was apparently taken to the [facility] ED by the police. After evaluation/medical clearance, patient was referred & transferred to the [facility] for psychiatric evaluation & treatments. Patient's current lab results reviewed. Her BAL was 63 upon her arrival to the ED.    24-hour Chart Review: Chart reviewed. Patient's case discussed in interdisciplinary team meeting.  Vital signs reviewed without critical value. No as needed psychotropic medication required overnight.  She slept a documented 7.75 hours.  She is adherent to taking psychotropic medication regimen. Nursing notes indicate patient is attending unit groups and activities.    Subjective: Melody Kim is seen in the interview room during rounds today.  She reports that her mood is euthymic, stable, and improved since admission, and she denies feeling down, depressed, or sad. She states that her anxiety is currently at a manageable level. Sleep and appetite are stable, concentration is without complaint, and energy level is adequate. She denies suicidal ideation, intent, or plan, as well as any homicidal ideation or psychotic symptoms. She also denies experiencing side effects from her current psychiatric medications. Discussed recent psychosocial  stressors, including a breakup with her boyfriend and challenges related to her relationship with her children. She expressed interest in outpatient therapy, preferably virtual, and is future oriented, stating plans to focus on personal growth and self-improvement. She reports needing transportation to [place] upon discharge.  Collateral: Dorn Stark (ex-partner/roommate) [phone]:   Collateral information obtained from her ex-partner/roommate indicates no acute safety concerns and confirms that the patient is able to return home.   Total Time spent with patient: 45 minutes  Past Psychiatric History: See H&P.  Past Medical History:  Past Medical History:  Diagnosis Date   Medical history non-contributory     Past Surgical History:  Procedure Laterality Date   WISDOM TOOTH EXTRACTION     Family History:  Family History  Problem Relation Age of Onset   Diabetes Mother    Diabetes Father    Family Psychiatric  History: See H&P.  Social History:  Social History   Substance and Sexual Activity  Alcohol Use No     Social History   Substance and Sexual Activity  Drug Use No    Social History   Socioeconomic History   Marital status: Single    Spouse name: Not on file   Number of children: Not on file   Years of education: Not on file   Highest education level: Not on file  Occupational History   Not on file  Tobacco Use   Smoking status: Never   Smokeless tobacco: Never  Substance and Sexual Activity   Alcohol use: No   Drug use: No   Sexual activity: Yes  Other Topics Concern   Not on file  Social History Narrative   Not on file   Social Drivers of Health  Tobacco Use: Low Risk (08/24/2024)   Patient History    Smoking Tobacco Use: Never    Smokeless Tobacco Use: Never    Passive Exposure: Not on file  Financial Resource Strain: Medium Risk (08/30/2021)   Received from [facility]   Overall Financial Resource Strain (CARDIA)    Difficulty  of Paying Living Expenses: Somewhat hard  Food Insecurity: No Food Insecurity (08/22/2024)   Epic    Worried About Radiation Protection Practitioner of Food in the Last Year: Never true    Ran Out of Food in the Last Year: Never true  Transportation Needs: No Transportation Needs (08/22/2024)   Epic    Lack of Transportation (Medical): No    Lack of Transportation (Non-Medical): No  Physical Activity: Not on file  Stress: Stress Concern Present (08/30/2021)   Received from [facility] - Occupational Stress Questionnaire    Feeling of Stress : Rather much  Social Connections: Not on file  Depression (PHQ2-9): Not on file  Alcohol Screen: Low Risk (08/22/2024)   Alcohol Screen    Last Alcohol Screening Score (AUDIT): 1  Housing: Low Risk (08/22/2024)   Epic    Unable to Pay for Housing in the Last Year: No    Number of Times Moved in the Last Year: 0    Homeless in the Last Year: No  Utilities: Not At Risk (08/22/2024)   Epic    Threatened with loss of utilities: No  Health Literacy: Not on file   Additional Social History:   Sleep: Good Estimated Sleeping Duration (Last 24 Hours): 6.75-8.00 hours  Appetite:  Good  Current Medications: Current Facility-Administered Medications  Medication Dose Route Frequency Provider Last Rate Last Admin   acetaminophen  (TYLENOL ) tablet 650 mg  650 mg Oral Q6H PRN Trudy Carwin, NP       alum & mag hydroxide-simeth (MAALOX/MYLANTA) 200-200-20 MG/5ML suspension 30 mL  30 mL Oral Q4H PRN Trudy Carwin, NP       busPIRone  (BUSPAR ) tablet 5 mg  5 mg Oral BID Nwoko, Agnes I, NP   5 mg at 08/25/24 1616   haloperidol  (HALDOL ) tablet 5 mg  5 mg Oral TID PRN Trudy Carwin, NP       And   diphenhydrAMINE  (BENADRYL ) capsule 50 mg  50 mg Oral TID PRN Trudy Carwin, NP       haloperidol  lactate (HALDOL ) injection 5 mg  5 mg Intramuscular TID PRN Trudy Carwin, NP       And   diphenhydrAMINE  (BENADRYL ) injection 50 mg  50 mg  Intramuscular TID PRN Trudy Carwin, NP       And   LORazepam  (ATIVAN ) injection 2 mg  2 mg Intramuscular TID PRN Trudy Carwin, NP       haloperidol  lactate (HALDOL ) injection 10 mg  10 mg Intramuscular TID PRN Trudy Carwin, NP       And   diphenhydrAMINE  (BENADRYL ) injection 50 mg  50 mg Intramuscular TID PRN Trudy Carwin, NP       And   LORazepam  (ATIVAN ) injection 2 mg  2 mg Intramuscular TID PRN Trudy Carwin, NP       FLUoxetine  (PROZAC ) capsule 10 mg  10 mg Oral Daily Nwoko, Mac I, NP   10 mg at 08/25/24 9175   hydrOXYzine  (ATARAX ) tablet 25 mg  25 mg Oral TID PRN Collene Mac I, NP       hydrOXYzine  (ATARAX ) tablet 50 mg  50 mg Oral QHS  Collene Gouge I, NP   50 mg at 08/24/24 2104   magnesium  hydroxide (MILK OF MAGNESIA) suspension 30 mL  30 mL Oral Daily PRN Trudy Carwin, NP        Lab Results: No results found for this or any previous visit (from the past 48 hours).  Blood Alcohol level:  Lab Results  Component Value Date   ETH 63 (H) 08/21/2024   ETH <11 08/08/2014   Metabolic Disorder Labs: No results found for: HGBA1C, MPG No results found for: PROLACTIN No results found for: CHOL, TRIG, HDL, CHOLHDL, VLDL, LDLCALC  Physical Findings: AIMS:  ,  N/A,  ,  ,  ,  ,   CIWA:   N/A COWS:   N/A  Musculoskeletal: Strength & Muscle Tone: within normal limits Gait & Station: normal Patient leans: N/A  Psychiatric Specialty Exam:  Presentation  General Appearance:  Appropriate for Environment; Casual  Eye Contact: Good  Speech: Clear and Coherent; Normal Rate  Speech Volume: Normal  Handedness:Right   Mood and Affect  Mood: Euthymic  Affect: Congruent  Thought Process  Thought Processes: Coherent; Goal Directed  Descriptions of Associations:Intact  Orientation:Full (Time, Place and Person)  Thought Content:Logical  History of Schizophrenia/Schizoaffective disorder:No  Duration of Psychotic  Symptoms:N/A  Hallucinations:Hallucinations: None   Ideas of Reference:None  Suicidal Thoughts:Suicidal Thoughts: No  Homicidal Thoughts:Homicidal Thoughts: No  Sensorium  Memory: Immediate Good; Recent Good  Judgment: Fair  Insight: Fair  Art Therapist  Concentration: Good  Attention Span: Good  Recall: Good  Fund of Knowledge: Fair  Language: Good  Psychomotor Activity  Psychomotor Activity: Psychomotor Activity: Normal  Assets  Assets: Communication Skills; Desire for Improvement; Housing; Resilience; Social Support; Talents/Skills  Sleep  Sleep: Sleep: Good Number of Hours of Sleep: 8  Physical Exam: Physical Exam Vitals and nursing note reviewed.  Constitutional:      General: She is not in acute distress.    Appearance: She is not ill-appearing.  HENT:     Head: Normocephalic.     Mouth/Throat:     Pharynx: Oropharynx is clear.  Pulmonary:     Effort: No respiratory distress.  Genitourinary:    Comments: Deferred. Musculoskeletal:        General: Normal range of motion.  Skin:    General: Skin is dry.  Neurological:     General: No focal deficit present.     Mental Status: She is alert and oriented to person, place, and time.    Review of Systems  Endo/Heme/Allergies:        Allergies: Egg shells (chicken).  Psychiatric/Behavioral:  Positive for depression ((Improving).) and substance abuse (BAL 63.). Negative for hallucinations, memory loss and suicidal ideas. The patient is not nervous/anxious and does not have insomnia.   All other systems reviewed and are negative.  Blood pressure 111/63, pulse 95, temperature 97.7 F (36.5 C), temperature source Oral, resp. rate 18, height 4' 11 (1.499 m), weight 54.1 kg, SpO2 99%, unknown if currently breastfeeding. Body mass index is 24.08 kg/m.  Treatment Plan Summary: Daily contact with patient to assess and evaluate symptoms and progress in treatment and Medication management.     Assessment: Melody Kim demonstrates continued clinical improvement with stable mood, manageable anxiety, and no evidence of acute safety concerns. She denies SI/HI or psychotic symptoms, and is tolerating her medications without adverse effects. Alcohol use appears situational in the context of acute stress, and the importance of complete abstinence from alcohol and other substances was discussed, emphasizing  its role in supporting mood stability, cognitive functioning, sleep, and anxiety reduction. Discharge planning was reviewed, including medication adherence and follow-up care to prevent symptom exacerbation. The patient will be provided with a taxi voucher for transportation, a three-day supply of medications, and arrangements for mail delivery of medications; she was instructed to contact the outpatient pharmacy to pay her co-pay. She remains polite, pleasant, and cooperative, engages easily in conversation, and is actively participating in unit groups and activities. Anticipated discharge Tuesday, December 16 pending the absence of new concerns  Principal/active diagnoses. Major depressive disorder, recurrent episode, moderate (HCC).  Generalized anxiety disorder.   Plan: - Continue Fluoxetine  10 mg po daily for depression.  - Continue Buspar  5 mg po bid for anxiety.  - Continue Hydroxyzine  25 mg po tid prn for anxiety.  - Continue Hydroxyzine  50 mg po Q hs for insomnia.  The risks/benefits/side-effects/alternatives to the medications in use were discussed in detail with the patient and time was given for patient's questions. The patient consents to medication trial.     Safety and Monitoring: Voluntary admission to inpatient psychiatric unit for safety, stabilization and treatment Daily contact with patient to assess and evaluate symptoms and progress in treatment Patient's case to be discussed in multi-disciplinary team meeting Observation Level : q15 minute checks Vital signs:  q12 hours Precautions: Safety   Discharge Planning: Social work and case management to assist with discharge planning and identification of hospital follow-up needs prior to discharge Estimated LOS: 3-5 days Discharge Concerns: Need to establish a safety plan; Medication compliance and effectiveness Discharge Goals: Return home with outpatient referrals for mental health follow-up including medication management/psychotherapy  Blair Chiquita Hint, NP 08/25/2024, 7:55 PM Patient ID: Melody Kim, female   DOB: 1996-01-24, 28 y.o.   MRN: [id number] Patient ID: Melody Kim, female   DOB: 14-Nov-1995, 28 y.o.   MRN: [id number]

## 2024-08-25 NOTE — Group Note (Signed)
 Date:  08/25/2024 Time:  9:38 AM  Group Topic/Focus:  Goals Group:   The focus of this group is to help patients establish daily goals to achieve during treatment and discuss how the patient can incorporate goal setting into their daily lives to aide in recovery. Orientation:   The focus of this group is to educate the patient on the purpose and policies of crisis stabilization and provide a format to answer questions about their admission.  The group details unit policies and expectations of patients while admitted.    Participation Level:  Active  Participation Quality:  Appropriate, Sharing, and Supportive  Affect:  Appropriate  Cognitive:  Appropriate  Insight: Appropriate  Engagement in Group:  Engaged  Modes of Intervention:  Activity and Discussion  Additional Comments:  N/A  Melody Kim 08/25/2024, 9:38 AM

## 2024-08-25 NOTE — Progress Notes (Signed)
°   08/25/24 1624  Psych Admission Type (Psych Patients Only)  Admission Status Voluntary

## 2024-08-25 NOTE — Group Note (Signed)
 Date:  08/25/2024 Time:  3:52 PM  Group Topic/Focus: Occupational Therapy    Pt did attend occupational therapy group  Melody Kim R Rockford Leinen 08/25/2024, 3:52 PM

## 2024-08-25 NOTE — Group Note (Signed)
 Date:  08/25/2024 Time:  12:49 PM  Group Topic/Focus:  Emotional Wellness: explore and understand the deeper layers of anger by using the anger iceberg model. This metaphor helps us  recognize that anger, like an iceberg, often only shows a small portion of what's truly beneath the surface. By delving into this concept, participants will develop a more nuanced view of their emotional experiences, enhancing self-awareness and promoting healthier emotional expression. Physical Wellness: educate participants on the vital role that sleep plays in overall physical wellness by watching a TED Talk on the science of sleep and engaging in a thoughtful discussion afterward. The session will focus on how sleep impacts various aspects of health, including cognitive function, physical recovery, immune health, and emotional well-being, and how participants can incorporate better sleep practices into their lives to improve their physical wellness.  Participation Level:  Active  Participation Quality:  Appropriate and Attentive  Affect:  Appropriate  Cognitive:  Appropriate  Insight: Appropriate  Engagement in Group:  Engaged  Modes of Intervention:  Discussion and Education  Additional Comments:  N/A  Lauris JONELLE Morales 08/25/2024, 12:49 PM

## 2024-08-25 NOTE — Plan of Care (Signed)

## 2024-08-25 NOTE — Progress Notes (Signed)
(  Sleep Hours) - 7.75 hours (Any PRNs that were needed, meds refused, or side effects to meds)- None (Any disturbances and when (visitation, over night)- None (Concerns raised by the patient)- None (SI/HI/AVH)-  Denies

## 2024-08-25 NOTE — Group Note (Signed)
 Date:  08/25/2024 Time:  10:12 AM  Group Topic/Focus: Recreational Therapy    Pt did attend recreational therapy group  Melody Kim R [name] 08/25/2024, 10:12 AM

## 2024-08-25 NOTE — Progress Notes (Signed)
°   08/25/24 1500  Psych Admission Type (Psych Patients Only)  Admission Status Involuntary  Psychosocial Assessment  Patient Complaints None  Eye Contact Fair  Facial Expression Animated  Affect Appropriate to circumstance  Speech Logical/coherent  Interaction Assertive  Motor Activity Other (Comment) (WNL)  Appearance/Hygiene Unremarkable  Behavior Characteristics Cooperative;Appropriate to situation  Mood Pleasant  Thought Process  Coherency WDL  Content WDL  Delusions None reported or observed  Perception WDL  Hallucination None reported or observed  Judgment Poor  Confusion None  Danger to Self  Current suicidal ideation? Denies  Self-Injurious Behavior No self-injurious ideation or behavior indicators observed or expressed   Agreement Not to Harm Self Yes  Description of Agreement verbal  Danger to Others  Danger to Others None reported or observed

## 2024-08-25 NOTE — BH IP Treatment Plan (Signed)
 Interdisciplinary Treatment and Diagnostic Plan Update  08/25/2024 Time of Session: 10:10 AM GENE COLEE MRN: [id number]  Principal Diagnosis: Major depressive disorder, recurrent episode, moderate (HCC)  Secondary Diagnoses: Principal Problem:   Major depressive disorder, recurrent episode, moderate (HCC) Active Problems:   Suicidal ideations   Generalized anxiety disorder   Current Medications:  Current Facility-Administered Medications  Medication Dose Route Frequency Provider Last Rate Last Admin   acetaminophen  (TYLENOL ) tablet 650 mg  650 mg Oral Q6H PRN Trudy Carwin, NP       alum & mag hydroxide-simeth (MAALOX/MYLANTA) 200-200-20 MG/5ML suspension 30 mL  30 mL Oral Q4H PRN Trudy Carwin, NP       busPIRone  (BUSPAR ) tablet 5 mg  5 mg Oral BID Nwoko, Agnes I, NP   5 mg at 08/25/24 9175   haloperidol  (HALDOL ) tablet 5 mg  5 mg Oral TID PRN Trudy Carwin, NP       And   diphenhydrAMINE  (BENADRYL ) capsule 50 mg  50 mg Oral TID PRN Trudy Carwin, NP       haloperidol  lactate (HALDOL ) injection 5 mg  5 mg Intramuscular TID PRN Trudy Carwin, NP       And   diphenhydrAMINE  (BENADRYL ) injection 50 mg  50 mg Intramuscular TID PRN Trudy Carwin, NP       And   LORazepam  (ATIVAN ) injection 2 mg  2 mg Intramuscular TID PRN Trudy Carwin, NP       haloperidol  lactate (HALDOL ) injection 10 mg  10 mg Intramuscular TID PRN Trudy Carwin, NP       And   diphenhydrAMINE  (BENADRYL ) injection 50 mg  50 mg Intramuscular TID PRN Trudy Carwin, NP       And   LORazepam  (ATIVAN ) injection 2 mg  2 mg Intramuscular TID PRN Trudy Carwin, NP       FLUoxetine  (PROZAC ) capsule 10 mg  10 mg Oral Daily Nwoko, Agnes I, NP   10 mg at 08/25/24 9175   hydrOXYzine  (ATARAX ) tablet 25 mg  25 mg Oral TID PRN Collene Gouge I, NP       hydrOXYzine  (ATARAX ) tablet 50 mg  50 mg Oral QHS Nwoko, Gouge I, NP   50 mg at 08/24/24 2104   magnesium  hydroxide (MILK OF MAGNESIA) suspension 30 mL  30 mL Oral Daily  PRN Trudy Carwin, NP       PTA Medications: Medications Prior to Admission  Medication Sig Dispense Refill Last Dose/Taking   ASHWAGANDHA GUMMIES PO Take 1 Dose by mouth daily as needed (anxiety).   Past Month   medroxyPROGESTERone (DEPO-PROVERA) 150 MG/ML injection Inject 150 mg into the muscle every 3 (three) months.   Taking   FLUoxetine  (PROZAC ) 10 MG capsule Take 10 mg by mouth daily.       Patient Stressors: Marital or family conflict   Medication change or noncompliance    Patient Strengths: Ability for insight  Active sense of humor  Average or above average intelligence  Communication skills  Work skills   Treatment Modalities: Medication Management, Group therapy, Case management,  1 to 1 session with clinician, Psychoeducation, Recreational therapy.   Physician Treatment Plan for Primary Diagnosis: Major depressive disorder, recurrent episode, moderate (HCC) Long Term Goal(s): Improvement in symptoms so as ready for discharge   Short Term Goals: Ability to identify and develop effective coping behaviors will improve Ability to maintain clinical measurements within normal limits will improve Compliance with prescribed medications will improve Ability to identify triggers associated with substance  abuse/mental health issues will improve Ability to identify changes in lifestyle to reduce recurrence of condition will improve Ability to verbalize feelings will improve Ability to disclose and discuss suicidal ideas Ability to demonstrate self-control will improve  Medication Management: Evaluate patient's response, side effects, and tolerance of medication regimen.  Therapeutic Interventions: 1 to 1 sessions, Unit Group sessions and Medication administration.  Evaluation of Outcomes: Adequate for Discharge  Physician Treatment Plan for Secondary Diagnosis: Principal Problem:   Major depressive disorder, recurrent episode, moderate (HCC) Active Problems:   Suicidal  ideations   Generalized anxiety disorder  Long Term Goal(s): Improvement in symptoms so as ready for discharge   Short Term Goals: Ability to identify and develop effective coping behaviors will improve Ability to maintain clinical measurements within normal limits will improve Compliance with prescribed medications will improve Ability to identify triggers associated with substance abuse/mental health issues will improve Ability to identify changes in lifestyle to reduce recurrence of condition will improve Ability to verbalize feelings will improve Ability to disclose and discuss suicidal ideas Ability to demonstrate self-control will improve     Medication Management: Evaluate patient's response, side effects, and tolerance of medication regimen.  Therapeutic Interventions: 1 to 1 sessions, Unit Group sessions and Medication administration.  Evaluation of Outcomes: Adequate for Discharge   RN Treatment Plan for Primary Diagnosis: Major depressive disorder, recurrent episode, moderate (HCC) Long Term Goal(s): Knowledge of disease and therapeutic regimen to maintain health will improve  Short Term Goals: Ability to remain free from injury will improve, Ability to verbalize frustration and anger appropriately will improve, Ability to demonstrate self-control, Ability to participate in decision making will improve, Ability to verbalize feelings will improve, Ability to disclose and discuss suicidal ideas, Ability to identify and develop effective coping behaviors will improve, and Compliance with prescribed medications will improve  Medication Management: RN will administer medications as ordered by provider, will assess and evaluate patient's response and provide education to patient for prescribed medication. RN will report any adverse and/or side effects to prescribing provider.  Therapeutic Interventions: 1 on 1 counseling sessions, Psychoeducation, Medication administration, Evaluate  responses to treatment, Monitor vital signs and CBGs as ordered, Perform/monitor CIWA, COWS, AIMS and Fall Risk screenings as ordered, Perform wound care treatments as ordered.  Evaluation of Outcomes: Adequate for Discharge   LCSW Treatment Plan for Primary Diagnosis: Major depressive disorder, recurrent episode, moderate (HCC) Long Term Goal(s): Safe transition to appropriate next level of care at discharge, Engage patient in therapeutic group addressing interpersonal concerns.  Short Term Goals: Engage patient in aftercare planning with referrals and resources, Increase social support, Increase ability to appropriately verbalize feelings, Increase emotional regulation, Facilitate acceptance of mental health diagnosis and concerns, Facilitate patient progression through stages of change regarding substance use diagnoses and concerns, Identify triggers associated with mental health/substance abuse issues, and Increase skills for wellness and recovery  Therapeutic Interventions: Assess for all discharge needs, 1 to 1 time with Social worker, Explore available resources and support systems, Assess for adequacy in community support network, Educate family and significant other(s) on suicide prevention, Complete Psychosocial Assessment, Interpersonal group therapy.  Evaluation of Outcomes: Adequate for Discharge   Progress in Treatment: Attending groups: Yes. Participating in groups: Yes. Taking medication as prescribed: Yes. Toleration medication: Yes. Family/Significant other contact made: Yes, individual(s) contacted:  Dorn Stark (ex-boyfriend / roommate) [phone].  Patient understands diagnosis: Yes. Discussing patient identified problems/goals with staff: Yes. Medical problems stabilized or resolved: Yes. Denies suicidal/homicidal ideation: Yes. Issues/concerns  per patient self-inventory: No. None reported.  New problem(s) identified: No, Describe:  None identified.  New Short  Term/Long Term Goal(s): medication stabilization, elimination of SI thoughts, development of comprehensive mental wellness plan.    Patient Goals:  To feel more like myself again. Not feel so heavy.  Discharge Plan or Barriers: Patient recently admitted. CSW will continue to follow and assess for appropriate referrals and possible discharge planning.    Reason for Continuation of Hospitalization: Anxiety Depression Medication stabilization  Estimated Length of Stay: 1-3 days.  Last 3 [place] Suicide Severity Risk Score: Flowsheet Row [facility] (Current[facility] Most recent reading at 08/22/2024  9:45 AM ED from 12/22/2022 in [facility] Most recent reading at 12/22/2022  6:32 PM UC from 12/22/2022 in [facility] Most recent reading at 12/22/2022 10:08 AM  C-SSRS RISK CATEGORY High Risk No Risk No Risk    Last PHQ 2/9 Scores:     No data to display          Scribe for Treatment Team: Louetta Lame, ISRAEL 08/25/2024 1:40 PM

## 2024-08-25 NOTE — Group Note (Signed)
 Occupational Therapy Group Note   Group Topic:Goal Setting  Group Date: 08/25/2024 Start Time: 1500 End Time: 1544 Facilitators: Dot [place] MATSU, OT   Group Description: Group encouraged engagement and participation through discussion focused on goal setting. Group members were introduced to goal-setting using the SMART Goal framework, identifying goals as Specific, Measureable, Acheivable, Relevant, and Time-Bound. Group members took time from group to create their own personal goal reflecting the SMART goal template and shared for review by peers and OT.    Therapeutic Goal(s):  Identify at least one goal that fits the SMART framework    Participation Level: Engaged   Participation Quality: Independent   Behavior: Appropriate, Attentive , and Cooperative   Speech/Thought Process: Relevant   Affect/Mood: Appropriate and Euthymic   Insight: Fair   Judgement: Fair and Improved      Modes of Intervention: Education  Patient Response to Interventions:  Attentive   Plan: Continue to engage patient in OT groups 2 - 3x/week.  08/25/2024  [place] MATSU Dot, OT Dennise Bamber, OT

## 2024-08-25 NOTE — Group Note (Signed)
 Date:  08/25/2024 Time:  3:07 PM  Group Topic/Focus: Grief and Loss Group    Pt did attend grief and loss group with the chaplain  Desyre Calma R Susi Goslin 08/25/2024, 3:07 PM

## 2024-08-26 ENCOUNTER — Other Ambulatory Visit: Payer: Self-pay

## 2024-08-26 DIAGNOSIS — R45851 Suicidal ideations: Secondary | ICD-10-CM

## 2024-08-26 DIAGNOSIS — F411 Generalized anxiety disorder: Secondary | ICD-10-CM

## 2024-08-26 DIAGNOSIS — F331 Major depressive disorder, recurrent, moderate: Principal | ICD-10-CM

## 2024-08-26 NOTE — Plan of Care (Signed)
   Problem: Education: Goal: Knowledge of [facility] Education information/materials will improve Outcome: Adequate for Discharge Goal: Emotional status will improve Outcome: Adequate for Discharge Goal: Mental status will improve Outcome: Adequate for Discharge Goal: Verbalization of understanding the information provided will improve Outcome: Adequate for Discharge   Problem: Activity: Goal: Interest or engagement in activities will improve Outcome: Adequate for Discharge Goal: Sleeping patterns will improve Outcome: Adequate for Discharge   Problem: Coping: Goal: Ability to verbalize frustrations and anger appropriately will improve Outcome: Adequate for Discharge Goal: Ability to demonstrate self-control will improve Outcome: Adequate for Discharge   Problem: Health Behavior/Discharge Planning: Goal: Identification of resources available to assist in meeting health care needs will improve Outcome: Adequate for Discharge Goal: Compliance with treatment plan for underlying cause of condition will improve Outcome: Adequate for Discharge   Problem: Physical Regulation: Goal: Ability to maintain clinical measurements within normal limits will improve Outcome: Adequate for Discharge

## 2024-08-26 NOTE — Progress Notes (Signed)
 Pt discharged to lobby. Pt was stable and appreciative at that time. All papers and prescriptions were given and valuables returned. Verbal understanding expressed. Denies SI/HI and A/VH. Pt given opportunity to express concerns and ask questions.

## 2024-08-26 NOTE — Discharge Summary (Signed)
 Physician Discharge Summary Note  Patient:  Melody Kim is an 28 y.o., female MRN:  [id number] DOB:  Jan 19, 1996 Patient phone:  [phone] (home)  Patient address:   [address] [place] [address],  Total Time spent with patient: 30 minutes  Date of Admission:  08/22/2024 Date of Discharge: 08/26/2024   Reason for Admission:  Reason for Admission: 28 year old AA female with hx of previous psychiatric hospitalization at the [facility] in 2022 for similar complaints (patient apparently put a gun to her head in a suicide attempt) at the time. Patient is being admitted to the [facility] from the [facility] with complaint of suicidal ideations with an undisclosed plan. Patient was apparently taken to the [facility] ED by the police. After evaluation/medical clearance, patient was referred & transferred to the [facility] for psychiatric evaluation & treatments. Patient's current lab results reviewed. Her BAL was 63 upon her arrival to the ED.    Melody Kim is planned for discharge home where she resides with her ex-partner/roommate. She has been referred for outpatient medication management and counseling services. She is aware of follow-up plans and demonstrates insight into the importance of ongoing treatment and abstaining from alcohol and psychoactive substances. Taffy is future oriented and motivated toward recovery. No current safety concerns or acute psychiatric symptoms observed. No withdrawal symptoms or cravings reported. She will be provided a three-day supply of medications, and arrangements for mail delivery of medications; she was instructed to contact the outpatient pharmacy to pay her co-pay.   Principal Problem: Major depressive disorder, recurrent episode, moderate (HCC) Discharge Diagnoses: Principal Problem:   Major depressive disorder, recurrent episode, moderate (HCC) Active Problems:   Suicidal ideations   Generalized anxiety  disorder   Past Psychiatric History: Previous psychiatric admission in 2022.   Past Medical History:  Past Medical History:  Diagnosis Date   Medical history non-contributory     Past Surgical History:  Procedure Laterality Date   WISDOM TOOTH EXTRACTION     Family History:  Family History  Problem Relation Age of Onset   Diabetes Mother    Diabetes Father    Family Psychiatric  History: Patient reports, I don't really know my family hx.  Social History:  Social History   Substance and Sexual Activity  Alcohol Use No     Social History   Substance and Sexual Activity  Drug Use No    Social History   Socioeconomic History   Marital status: Single    Spouse name: Not on file   Number of children: Not on file   Years of education: Not on file   Highest education level: Not on file  Occupational History   Not on file  Tobacco Use   Smoking status: Never   Smokeless tobacco: Never  Substance and Sexual Activity   Alcohol use: No   Drug use: No   Sexual activity: Yes  Other Topics Concern   Not on file  Social History Narrative   Not on file   Social Drivers of Health   Tobacco Use: Low Risk (08/24/2024)   Patient History    Smoking Tobacco Use: Never    Smokeless Tobacco Use: Never    Passive Exposure: Not on file  Financial Resource Strain: Medium Risk (08/30/2021)   Received from [facility]   Overall Financial Resource Strain (CARDIA)    Difficulty of Paying Living Expenses: Somewhat hard  Food Insecurity: No Food Insecurity (08/22/2024)   Epic  Worried About Programme Researcher, Broadcasting/film/video in the Last Year: Never true    Ran Out of Food in the Last Year: Never true  Transportation Needs: No Transportation Needs (08/22/2024)   Epic    Lack of Transportation (Medical): No    Lack of Transportation (Non-Medical): No  Physical Activity: Not on file  Stress: Stress Concern Present (08/30/2021)   [facility] from [facility]  Occupational Health - Occupational Stress Questionnaire    Feeling of Stress : Rather much  Social Connections: Not on file  Depression (PHQ2-9): Not on file  Alcohol Screen: Low Risk (08/22/2024)   Alcohol Screen    Last Alcohol Screening Score (AUDIT): 1  Housing: Low Risk (08/22/2024)   Epic    Unable to Pay for Housing in the Last Year: No    Number of Times Moved in the Last Year: 0    Homeless in the Last Year: No  Utilities: Not At Risk (08/22/2024)   Epic    Threatened with loss of utilities: No  Health Literacy: Not on file    Hospital Course:   During the patient's hospitalization, patient had extensive initial psychiatric evaluation, and follow-up psychiatric evaluations every day.  Psychiatric diagnoses provided upon initial assessment:  Principal Problem:   Major depressive disorder, recurrent episode, moderate (HCC) Active Problems:   Suicidal ideations   Generalized anxiety disorder  Patient's psychiatric medications were adjusted on admission:  Fluoxetine  10 mg daily was started for depression.  Buspirone  5 mg twice daily was initiated for anxiety.  Hydroxyzine  50 mg nightly was initiated for insomnia.  During the hospitalization, the patient tolerated these medications without adverse effects, and no further adjustments were required. At discharge, the patient is to continue Fluoxetine  10 mg daily for depression, Buspirone  5 mg twice daily for anxiety, and Hydroxyzine  50 mg nightly for insomnia.  Continue Fluoxetine  10 mg po daily for depression.  Patient's care was discussed during the interdisciplinary team meeting every day during the hospitalization.  The patient denies having side effects to prescribed psychiatric medication.  Gradually, patient started adjusting to milieu. The patient was evaluated each day by a clinical provider to ascertain response to treatment. Improvement was noted by the patient's report of decreasing symptoms, improved sleep and  appetite, affect, medication tolerance, behavior, and participation in unit programming.  Patient was asked each day to complete a self inventory noting mood, mental status, pain, new symptoms, anxiety and concerns.    Symptoms were reported as significantly decreased or resolved completely by discharge.   On day of discharge, the patient reports that their mood is stable. The patient denied having suicidal thoughts for more than 48 hours prior to discharge.  Patient denies having homicidal thoughts.  Patient denies having auditory hallucinations.  Patient denies any visual hallucinations or other symptoms of psychosis. The patient was motivated to continue taking medication with a goal of continued improvement in mental health.   The patient reports their target psychiatric symptoms of worsening depression and anxiety responded well to the psychiatric medications, and the patient reports overall benefit other psychiatric hospitalization. Supportive psychotherapy was provided to the patient. The patient also participated in regular group therapy while hospitalized. Coping skills, problem solving as well as relaxation therapies were also part of the unit programming.  Labs were reviewed with the patient, and abnormal results were discussed with the patient.  The patient is able to verbalize their individual safety plan to this provider.  # It is  recommended to the patient to continue psychiatric medications as prescribed, after discharge from the hospital.    # It is recommended to the patient to follow up with your outpatient psychiatric provider and PCP.  # It was discussed with the patient, the impact of alcohol, drugs, tobacco have been there overall psychiatric and medical wellbeing, and total abstinence from substance use was recommended the patient.ed.  # Prescriptions provided or sent directly to preferred pharmacy at discharge. Patient agreeable to plan. Given opportunity to ask questions.  Appears to feel comfortable with discharge.    # In the event of worsening symptoms, the patient is instructed to call the crisis hotline, 911 and or go to the nearest ED for appropriate evaluation and treatment of symptoms. To follow-up with primary care provider for other medical issues, concerns and or health care needs  # Patient was discharged home with a plan to follow up as noted below.   Physical Findings: AIMS:  ,N/A ,  ,  ,  ,  ,   CIWA:   N/A COWS:   N/A  Musculoskeletal: Strength & Muscle Tone: within normal limits Gait & Station: normal Patient leans: N/A   Psychiatric Specialty Exam:  Presentation  General Appearance:  Appropriate for Environment; Casual  Eye Contact: Good  Speech: Clear and Coherent; Normal Rate  Speech Volume: Normal  Handedness: Right   Mood and Affect  Mood: Euthymic  Affect: Appropriate; Full Range   Thought Process  Thought Processes: Coherent; Linear  Descriptions of Associations:Intact  Orientation:Full (Time, Place and Person)  Thought Content:Logical  History of Schizophrenia/Schizoaffective disorder:No  Duration of Psychotic Symptoms:N/A  Hallucinations:Hallucinations: None  Ideas of Reference:None  Suicidal Thoughts:Suicidal Thoughts: No  Homicidal Thoughts:Homicidal Thoughts: No   Sensorium  Memory: Immediate Good; Recent Good; Remote Good  Judgment: Intact  Insight: Present   Executive Functions  Concentration: Good  Attention Span: Good  Recall: Good  Fund of Knowledge: Good  Language: Good   Psychomotor Activity  Psychomotor Activity: Psychomotor Activity: Normal   Assets  Assets: Communication Skills; Desire for Improvement; Financial Resources/Insurance; Housing; Leisure Time; Physical Health; Resilience; Social Support; Talents/Skills   Sleep  Sleep: Sleep: Good  Estimated Sleeping Duration (Last 24 Hours): 7.50-8.25 hours   Physical Exam: Physical  Exam Vitals and nursing note reviewed.  Constitutional:      General: She is not in acute distress.    Appearance: She is not ill-appearing.  HENT:     Head: Normocephalic.     Mouth/Throat:     Pharynx: Oropharynx is clear.  Pulmonary:     Effort: No respiratory distress.  Musculoskeletal:        General: Normal range of motion.  Neurological:     General: No focal deficit present.     Mental Status: She is alert and oriented to person, place, and time.  Psychiatric:        Mood and Affect: Mood normal.        Behavior: Behavior normal.        Thought Content: Thought content normal.        Judgment: Judgment normal.    Review of Systems  Psychiatric/Behavioral:  Positive for depression and substance abuse (BAL 63 on arrival to ED). Negative for hallucinations, memory loss and suicidal ideas. The patient is nervous/anxious. The patient does not have insomnia.        Stable for lower level of care.   All other systems reviewed and are negative.  Blood pressure 114/72,  pulse 82, temperature (!) 97.3 F (36.3 C), temperature source Oral, resp. rate 16, height 4' 11 (1.499 m), weight 54.1 kg, SpO2 98%, unknown if currently breastfeeding. Body mass index is 24.08 kg/m.   Tobacco Use History[1] Tobacco Cessation:  N/A, patient does not currently use tobacco products   Blood Alcohol level:  Lab Results  Component Value Date   ETH 63 (H) 08/21/2024   ETH <11 08/08/2014    Metabolic Disorder Labs:  No results found for: HGBA1C, MPG No results found for: PROLACTIN No results found for: CHOL, TRIG, HDL, CHOLHDL, VLDL, LDLCALC  See Psychiatric Specialty Exam and Suicide Risk Assessment completed by Attending Physician prior to discharge.  Discharge destination:  Home  Is patient on multiple antipsychotic therapies at discharge:  No   Has Patient had three or more failed trials of antipsychotic monotherapy by history:  No  Recommended Plan for Multiple  Antipsychotic Therapies: NA  Discharge Instructions     Activity as tolerated - No restrictions   Complete by: As directed       Allergies as of 08/26/2024       Reactions   Eggshell Membrane (chicken) [egg Shells] Swelling   Facial and eye swelling as a child        Medication List     STOP taking these medications    ASHWAGANDHA GUMMIES PO       TAKE these medications      Indication  busPIRone  5 MG tablet Commonly known as: BUSPAR  Take 1 tablet (5 mg total) by mouth 2 (two) times daily.  Indication: Anxiety Disorder   FLUoxetine  10 MG capsule Commonly known as: PROZAC  Take 1 capsule (10 mg total) by mouth daily.  Indication: Major Depressive Disorder   hydrOXYzine  50 MG tablet Commonly known as: ATARAX  Take 1 tablet (50 mg total) by mouth at bedtime.  Indication: Feeling Anxious, Insomnia.   medroxyPROGESTERone 150 MG/ML injection Commonly known as: DEPO-PROVERA Inject 150 mg into the muscle every 3 (three) months.  Indication: Birth Control Treatment        Follow-up Information     [facility] Follow up on 09/02/2024.   Why: You have a hospital follow up appointment for therapy and medication management services on 09/02/24 at 8:00 am.  The appointment will be Virtual, telehealth. Contact information: [address]  [address] [place] [address] [phone]                 Follow-up recommendations:   Activity: as tolerated  Diet: heart healthy  Other: -Follow-up with your outpatient psychiatric provider -instructions on appointment date, time, and address (location) are provided to you in discharge paperwork.  -Take your psychiatric medications as prescribed at discharge - instructions are provided to you in the discharge paperwork  -Follow-up with outpatient primary care doctor and other specialists -for management of preventative medicine and chronic medical disease  -Testing: Follow-up with outpatient provider for  abnormal lab results: N/A  -If you are prescribed an atypical antipsychotic medication, we recommend that your outpatient psychiatrist follow routine screening for side effects within 3 months of discharge, including monitoring: AIMS scale, height, weight, blood pressure, fasting lipid panel, HbA1c, and fasting blood sugar.   -Recommend total abstinence from alcohol, tobacco, and other illicit drug use at discharge.   -If your psychiatric symptoms recur, worsen, or if you have side effects to your psychiatric medications, call your outpatient psychiatric provider, 911, [phone] or go to the nearest emergency department.  -If suicidal thoughts  occur, immediately call your outpatient psychiatric provider, 911, [phone] or go to the nearest emergency department.   Comments:  Follow all discharge instructions as recommended.   Signed: Blair Chiquita Hint, NP 08/26/2024, 5:04 PM           [1]  Social History Tobacco Use  Smoking Status Never  Smokeless Tobacco Never

## 2024-08-26 NOTE — Transportation (Signed)
 08/26/2024  Melody Kim DOB: 1996-08-25 MRN: [id number]   RIDER WAIVER AND RELEASE OF LIABILITY  For the purposes of helping with transportation needs, [facility] partners with outside transportation providers (taxi companies, [place], catering manager.) to give [place] patients or other approved people the choice of on-demand rides Public Librarian) to our buildings for non-emergency visits.  By using [employer], I, the person signing this document, on behalf of myself and/or any legal minors (in my care using the [employer]), agree:  Science Writer given to me are supplied by independent, outside transportation providers who do not work for, or have any affiliation with, [employer]. [place] is not a transportation company. [facility] has no control over the quality or safety of the rides I get using [employer]. [place] has no control over whether any outside ride will happen on time or not. [facility] gives no guarantee on the reliability, quality, safety, or availability on any rides, or that no mistakes will happen. I know and accept that traveling by vehicle (car, truck, SVU, fleeta, bus, taxi, etc.) has risks of serious injuries such as disability, being paralyzed, and death. I know and agree the risk of using [employer] is mine alone, and not [employer]. [employer] are provided as is and as are available. The transportation providers are in charge for all inspections and care of the vehicles used to provide these rides. I agree not to take legal action against [place], its agents, employees, officers, directors, representatives, insurers, attorneys, assigns, successors, subsidiaries, and affiliates at any time for any reasons related directly or indirectly to using [employer]. I also agree not to take legal action against [place] or its affiliates for any injury, death, or damage to property caused by or related to  using [employer]. I have read this Waiver and Release of Liability, and I understand the terms used in it and their legal meaning. This Waiver is freely and voluntarily given with the understanding that my right (or any legal minors) to legal action against [place] relating to [employer] is knowingly given up to use these services.   I attest that I read the Ride Waiver and Release of Liability to Melody Kim, gave Ms. Bernard the opportunity to ask questions and answered the questions asked (if any). I affirm that Melody Kim then provided consent for assistance with transportation.

## 2024-08-26 NOTE — Group Note (Signed)
 Date:  08/26/2024 Time:  9:58 AM  Group Topic/Focus: Goals orientation Goals Group:   The focus of this group is to help patients establish daily goals to achieve during treatment and discuss how the patient can incorporate goal setting into their daily lives to aide in recovery. Orientation:   The focus of this group is to educate the patient on the purpose and policies of crisis stabilization and provide a format to answer questions about their admission.  The group details unit policies and expectations of patients while admitted.    Participation Level:  Active  Participation Quality:  Appropriate  Affect:  Appropriate  Cognitive:  Appropriate  Insight: Appropriate  Engagement in Group:  Improving  Modes of Intervention:  Discussion and Orientation  Additional Comments:    Melody Kim 08/26/2024, 9:58 AM

## 2024-08-26 NOTE — Progress Notes (Signed)
°   08/26/24 0800  Psych Admission Type (Psych Patients Only)  Admission Status Voluntary  Psychosocial Assessment  Patient Complaints None  Eye Contact Fair  Facial Expression Anxious  Affect Anxious  Speech Logical/coherent  Interaction Assertive  Motor Activity Slow  Appearance/Hygiene Unremarkable  Behavior Characteristics Cooperative  Mood Anxious  Thought Process  Coherency WDL  Content WDL  Delusions None reported or observed  Perception WDL  Hallucination None reported or observed  Judgment WDL  Confusion WDL  Danger to Self  Current suicidal ideation? Denies  Self-Injurious Behavior No self-injurious ideation or behavior indicators observed or expressed   Agreement Not to Harm Self Yes  Description of Agreement Verbal  Danger to Others  Danger to Others None reported or observed

## 2024-08-26 NOTE — Progress Notes (Signed)
°  [facility] Adult Case Management Discharge Plan :  Will you be returning to the same living situation after discharge:  Yes,  patient will be returning home to address on file. At discharge, do you have transportation home?: No. CSW arranged taxi voucher through [employer] for 9:45 am.  Do you have the ability to pay for your medications: Yes,  patient has active health insurance. Pharmacy provided samples for the patient due to limited access to transportation.  Release of information consent forms completed and in the chart;  Patient's signature needed at discharge.  Patient to Follow up at:  Follow-up Information     [facility] Follow up on 09/02/2024.   Why: You have a hospital follow up appointment for therapy and medication management services on 09/02/24 at 8:00 am.  The appointment will be Virtual, telehealth. Contact information: [address]  [address] [place] [address] [phone]                 Next level of care provider has access to [facility] Link:no  Safety Planning and Suicide Prevention discussed: Yes,  Dorn Stark (ex-boyfriend/roommate) [phone].      Has patient been referred to the Quitline?: Patient does not use tobacco/nicotine products  Patient has been referred for addiction treatment: No known substance use disorder.  Louetta Lame, LCSWA 08/26/2024, 9:08 AM

## 2024-08-26 NOTE — [facility] Suicide Risk Assessment (Signed)
 Suicide Risk Assessment  Discharge Assessment    [facility] Discharge Suicide Risk Assessment   Principal Problem: Major depressive disorder, recurrent episode, moderate (HCC) Discharge Diagnoses: Principal Problem:   Major depressive disorder, recurrent episode, moderate (HCC) Active Problems:   Suicidal ideations   Generalized anxiety disorder   Reason for Admission: 28 year old AA female with hx of previous psychiatric hospitalization at the [facility] in 2022 for similar complaints (patient apparently put a gun to her head in a suicide attempt) at the time. Patient is being admitted to the [facility] from the [facility] with complaint of suicidal ideations with an undisclosed plan. Patient was apparently taken to the [facility] ED by the police. After evaluation/medical clearance, patient was referred & transferred to the [facility] for psychiatric evaluation & treatments. Patient's current lab results reviewed. Her BAL was 63 upon her arrival to the ED.    Total Time spent with patient: 30 minutes  Musculoskeletal: Strength & Muscle Tone: within normal limits Gait & Station: normal Patient leans: N/A  Psychiatric Specialty Exam  Presentation  General Appearance:  Appropriate for Environment; Casual  Eye Contact: Good  Speech: Clear and Coherent; Normal Rate  Speech Volume: Normal  Handedness: Right   Mood and Affect  Mood: Euthymic  Duration of Depression Symptoms: Greater than two weeks  Affect: Appropriate; Full Range   Thought Process  Thought Processes: Coherent; Linear  Descriptions of Associations:Intact  Orientation:Full (Time, Place and Person)  Thought Content:Logical  History of Schizophrenia/Schizoaffective disorder:No  Duration of Psychotic Symptoms:N/A  Hallucinations:Hallucinations: None  Ideas of Reference:None  Suicidal Thoughts:Suicidal Thoughts: No  Homicidal Thoughts:Homicidal Thoughts: No   Sensorium   Memory: Immediate Good; Recent Good; Remote Good  Judgment: Intact  Insight: Present   Executive Functions  Concentration: Good  Attention Span: Good  Recall: Good  Fund of Knowledge: Good  Language: Good   Psychomotor Activity  Psychomotor Activity: Psychomotor Activity: Normal   Assets  Assets: Communication Skills; Desire for Improvement; Financial Resources/Insurance; Housing; Leisure Time; Physical Health; Resilience; Social Support; Talents/Skills   Sleep  Sleep: Sleep: Good  Estimated Sleeping Duration (Last 24 Hours): 8.00-8.75 hours  Physical Exam: Physical Exam ROS Blood pressure 114/72, pulse 82, temperature (!) 97.3 F (36.3 C), temperature source Oral, resp. rate 16, height 4' 11 (1.499 m), weight 54.1 kg, SpO2 98%, unknown if currently breastfeeding. Body mass index is 24.08 kg/m.  Mental Status Per Nursing Assessment::   On Admission:  Suicidal ideation indicated by patient (Verbal contract of safety has been in place.)  Demographic Factors:  Adolescent or young adult  Loss Factors: NA  Historical Factors: Impulsivity  Risk Reduction Factors:   Responsible for children under 31 years of age, Sense of responsibility to family, Religious beliefs about death, Employed, Living with another person, especially a relative, Positive social support, Positive therapeutic relationship, and Positive coping skills or problem solving skills  Continued Clinical Symptoms:  More than one psychiatric diagnosis Previous Psychiatric Diagnoses and Treatments  Cognitive Features That Contribute To Risk:  None    Suicide Risk:  Minimal: No current suicidal thoughts.  No current homicidal thoughts.  No current thoughts of violence.  Modifiable risk factor targeted during this admission as mood symptoms.  Patient is stable on her current regimen.  No evidence of mania.  No evidence of psychosis.  No evidence of dissociation.  No new psychosocial  stressor. Patient is stable for care at the lower setting.    Follow-up Information  [facility] Follow up on 09/02/2024.   Why: You have a hospital follow up appointment for therapy and medication management services on 09/02/24 at 8:00 am.  The appointment will be Virtual, telehealth. Contact information: [address]  [address] [place] [address] [phone]                 Plan Of Care/Follow-up recommendations:  See discharge summary.   Blair Chiquita Hint, NP 08/26/2024, 6:23 AM

## 2024-08-28 ENCOUNTER — Other Ambulatory Visit: Payer: Self-pay

## 2024-09-01 ENCOUNTER — Other Ambulatory Visit (HOSPITAL_COMMUNITY): Payer: Self-pay

## 2024-09-25 ENCOUNTER — Other Ambulatory Visit (HOSPITAL_COMMUNITY): Payer: Self-pay

## 2024-09-26 ENCOUNTER — Other Ambulatory Visit (HOSPITAL_COMMUNITY): Payer: Self-pay
